# Patient Record
Sex: Male | Born: 1960 | Hispanic: Yes | Marital: Married | Smoking: Former smoker
Health system: Southern US, Community
[De-identification: ages and names within clinical notes are randomized; demographics above are authoritative.]

## PROBLEM LIST (undated history)

## (undated) DIAGNOSIS — R569 Unspecified convulsions: Secondary | ICD-10-CM

## (undated) NOTE — *Deleted (*Deleted)
  [facility] EMERGENCY DEPARTMENT Provider Note   CSN: [id number] Arrival date & time: 09/08/20  1703     History Chief Complaint  Patient presents with  . Seizures    Darrell Thompson is a 64 y.o. male.  HPI     Past Medical History:  Diagnosis Date  . Hypertension 2020    Patient Active Problem List   Diagnosis Date Noted  . Hypertension 2020    No past surgical history on file.     Family History  Problem Relation Age of Onset  . Heart disease Mother     Social History   Tobacco Use  . Smoking status: Former Smoker    Packs/day: 1.00    Types: Cigarettes    Quit date: 10/26/1983    Years since quitting: 36.8  . Smokeless tobacco: Never Used  Vaping Use  . Vaping Use: Never used  Substance Use Topics  . Alcohol use: Yes    Comment: Rare--maybe Christmas  . Drug use: Never    Home Medications Prior to Admission medications   Medication Sig Start Date End Date Taking? Authorizing Provider  lisinopril (ZESTRIL) 10 MG tablet Take 1 tablet (10 mg total) by mouth daily. 07/15/20   Julieanne Manson, MD  metFORMIN (GLUCOPHAGE-XR) 500 MG 24 hr tablet 1 tab by mouth twice daily with a meal 07/15/20   Julieanne Manson, MD  simvastatin (ZOCOR) 40 MG tablet Take 1 tablet (40 mg total) by mouth at bedtime. 07/15/20   Julieanne Manson, MD    Allergies    Patient has no known allergies.  Review of Systems   Review of Systems  Physical Exam Updated Vital Signs BP 133/74 (BP Location: Right Arm)   Pulse (!) 117   Temp 97.9 F (36.6 C) (Oral)   Resp 17   SpO2 93%   Physical Exam  ED Results / Procedures / Treatments   Labs (all labs ordered are listed, but only abnormal results are displayed) Labs Reviewed - No data to display  EKG None  Radiology No results found.  Procedures Procedures (including critical care time)  Medications Ordered in ED Medications - No data to display  ED Course  I have reviewed the triage  vital signs and the nursing notes.  Pertinent labs & imaging results that were available during my care of the patient were reviewed by me and considered in my medical decision making (see chart for details).    MDM Rules/Calculators/A&P                          *** Final Clinical Impression(s) / ED Diagnoses Final diagnoses:  None    Rx / DC Orders ED Discharge Orders    None

---

## 2018-10-25 DIAGNOSIS — I10 Essential (primary) hypertension: Secondary | ICD-10-CM

## 2018-10-25 HISTORY — DX: Essential (primary) hypertension: I10

## 2020-06-13 ENCOUNTER — Ambulatory Visit: Payer: Self-pay | Admitting: Internal Medicine

## 2020-06-13 ENCOUNTER — Encounter: Payer: Self-pay | Admitting: Internal Medicine

## 2020-06-13 ENCOUNTER — Other Ambulatory Visit: Payer: Self-pay

## 2020-06-13 VITALS — BP 122/84 | HR 72 | Resp 12 | Ht 61.5 in | Wt 152.0 lb

## 2020-06-13 DIAGNOSIS — H6122 Impacted cerumen, left ear: Secondary | ICD-10-CM

## 2020-06-13 DIAGNOSIS — M7581 Other shoulder lesions, right shoulder: Secondary | ICD-10-CM

## 2020-06-13 DIAGNOSIS — I10 Essential (primary) hypertension: Secondary | ICD-10-CM

## 2020-06-13 NOTE — Progress Notes (Signed)
Subjective:    Patient ID: Darrell Thompson, male   DOB: 05-09-1961, 59 y.o.   MRN: [id number]   HPI   Here to establish Violeta Gelinas Cruz's son in law.    1.  Ringing in ear:  Swimming in pool about 8 weeks ago.  Got water in ear and then ringing started.  Describes continuous high pitched noise.  No hearing loss or pain from ear.  No drainage from ear. Obtained some drops OTC from pharmacy, but no help with ringing.   2.  Left sided neck pain and upper right arm pain also starting about 8 weeks ago.  States he cannot abduct right shoulder above 90 degrees. States awakened with the pain. Does not remember an acute injury.  Cannot recall repetitive movement or work prior that may have set this off. Sleeps with his right hand on his chest and then on his abdomen. No numbness or tingling in his hand.    3.  Hypertension:  Diagnosed about 1 year ago.  Does well on Lisinopril.   Current Meds  Medication Sig  . lisinopril (ZESTRIL) 10 MG tablet Take 10 mg by mouth daily.   No Known Allergies   Past Medical History:  Diagnosis Date  . Hypertension 2020    History reviewed. No pertinent surgical history.  Family History  Problem Relation Age of Onset  . Heart disease Mother     Social History   Socioeconomic History  . Marital status: Married    Spouse name: [place]  . Number of children: 6  . Years of education: Not on file  . Highest education level: Some college, no degree  Occupational History  . Occupation: Airline pilot in [employer]  Tobacco Use  . Smoking status: Former Smoker    Packs/day: 1.00    Types: Cigarettes    Quit date: 10/26/1983    Years since quitting: 36.6  . Smokeless tobacco: Never Used  Vaping Use  . Vaping Use: Never used  Substance and Sexual Activity  . Alcohol use: Yes    Comment: Rare--maybe Christmas  . Drug use: Never  . Sexual activity: Not on file  Other Topics Concern  . Not on file  Social History Narrative   Lives at home with wife,  mother in law (also a patient here), son and daughter   Social Determinants of Health   Financial Resource Strain:   . Difficulty of Paying Living Expenses: Not on file  Food Insecurity: No Food Insecurity  . Worried About Programme researcher, broadcasting/film/video in the Last Year: Never true  . Ran Out of Food in the Last Year: Never true  Transportation Needs: No Transportation Needs  . Lack of Transportation (Medical): No  . Lack of Transportation (Non-Medical): No  Physical Activity:   . Days of Exercise per Week: Not on file  . Minutes of Exercise per Session: Not on file  Stress:   . Feeling of Stress : Not on file  Social Connections:   . Frequency of Communication with Friends and Family: Not on file  . Frequency of Social Gatherings with Friends and Family: Not on file  . Attends Religious Services: Not on file  . Active Member of Clubs or Organizations: Not on file  . Attends Banker Meetings: Not on file  . Marital Status: Not on file  Intimate Partner Violence: Not At Risk  . Fear of Current or Ex-Partner: No  . Emotionally Abused: No  . Physically Abused:  No  . Sexually Abused: No      Review of Systems    Objective:   BP 122/84 (BP Location: Left Arm, Patient Position: Sitting, Cuff Size: Normal)   Pulse 72   Resp 12   Ht 5' 1.5" (1.562 m)   Wt 152 lb (68.9 kg)   BMI 28.26 kg/m   Physical Exam  NAD HEENT:  PERRL, EOMI, TM on right pearly gray with normal canal.  Dark cerumen impacted in left canal deep and likely in contact with TM, which cannot be viewed. Neck:  Mild tenderness and tightness of left trap and cervical paraspinous musculature.   Chest:  CTA CV:  RRR with normal S1 and S2, No S3, S4 or murmur.  No carotid bruits.  Carotid, radial and DP pulses normal and equal Abd:  S, NT, No HSM or mass, + BS LE:  No edema MS, right UE:  Unable to abduct or flex right shoulder above 90 degrees and painful at full passive ROM with these movements.  Mild  tenderness over subacromial bursa.  NT over [facility] and CC joints as well as scapula and right trap.  Decreased resistance against downward force on forearm with shoulder abducted to 90 degrees and internally rotated.   Assessment & Plan   1.  Left sided cerumen impaction:  Debrox drops in ear twice daily for 2 days prior to lavage--return next week to clear out.  2.  Right rotator cuff tendinitis:  Went over gentle ROM exercises.  Not interested in PT at this time.  Ibuprofen 400-600 mg twice daily with meals for 14 days.  3.  Hypertension:  Refilled Lisinopril.  Fasting labs same day returns for ear lavage/curettage.  4.  HM:  Schedule for flu vaccine.  With me in 4 months for CPE

## 2020-06-13 NOTE — Patient Instructions (Signed)
Debrox or Visine ear drops for ear wax softening and then removal --3-4 drops twice daily in left ear for 2 days prior to appt  Ibuprofen 400-600 mg twice daily with meals for 2 weeks  Range of motion exercises for right shoulder twice daily.

## 2020-06-19 ENCOUNTER — Other Ambulatory Visit: Payer: Self-pay

## 2020-06-19 DIAGNOSIS — Z79899 Other long term (current) drug therapy: Secondary | ICD-10-CM

## 2020-06-19 DIAGNOSIS — Z13220 Encounter for screening for lipoid disorders: Secondary | ICD-10-CM

## 2020-06-19 DIAGNOSIS — Z125 Encounter for screening for malignant neoplasm of prostate: Secondary | ICD-10-CM

## 2020-06-20 LAB — COMPREHENSIVE METABOLIC PANEL
ALT: 32 IU/L (ref 0–44)
AST: 20 IU/L (ref 0–40)
Albumin/Globulin Ratio: 1.4 (ref 1.2–2.2)
Albumin: 4.4 g/dL (ref 3.8–4.9)
Alkaline Phosphatase: 122 IU/L — ABNORMAL HIGH (ref 48–121)
BUN/Creatinine Ratio: 15 (ref 10–24)
BUN: 16 mg/dL (ref 8–27)
Bilirubin Total: 0.6 mg/dL (ref 0.0–1.2)
CO2: 23 mmol/L (ref 20–29)
Calcium: 9.7 mg/dL (ref 8.6–10.2)
Chloride: 99 mmol/L (ref 96–106)
Creatinine, Ser: 1.05 mg/dL (ref 0.76–1.27)
GFR calc Af Amer: 89 mL/min/{1.73_m2} (ref >59)
GFR calc non Af Amer: 77 mL/min/{1.73_m2} (ref >59)
Globulin, Total: 3.2 g/dL (ref 1.5–4.5)
Glucose: 103 mg/dL — ABNORMAL HIGH (ref 65–99)
Potassium: 4.9 mmol/L (ref 3.5–5.2)
Sodium: 139 mmol/L (ref 134–144)
Total Protein: 7.6 g/dL (ref 6.0–8.5)

## 2020-06-20 LAB — CBC WITH DIFFERENTIAL/PLATELET
Basophils Absolute: 0.1 10*3/uL (ref 0.0–0.2)
Basos: 1 %
EOS (ABSOLUTE): 0.2 10*3/uL (ref 0.0–0.4)
Eos: 2 %
Hematocrit: 50.8 % (ref 37.5–51.0)
Hemoglobin: 16.8 g/dL (ref 13.0–17.7)
Immature Grans (Abs): 0 10*3/uL (ref 0.0–0.1)
Immature Granulocytes: 0 %
Lymphocytes Absolute: 3.6 10*3/uL — ABNORMAL HIGH (ref 0.7–3.1)
Lymphs: 44 %
MCH: 30 pg (ref 26.6–33.0)
MCHC: 33.1 g/dL (ref 31.5–35.7)
MCV: 91 fL (ref 79–97)
Monocytes Absolute: 0.7 10*3/uL (ref 0.1–0.9)
Monocytes: 8 %
Neutrophils Absolute: 3.6 10*3/uL (ref 1.4–7.0)
Neutrophils: 45 %
Platelets: 254 10*3/uL (ref 150–450)
RBC: 5.6 x10E6/uL (ref 4.14–5.80)
RDW: 13.6 % (ref 11.6–15.4)
WBC: 8 10*3/uL (ref 3.4–10.8)

## 2020-06-20 LAB — LIPID PANEL W/O CHOL/HDL RATIO
Cholesterol, Total: 262 mg/dL — ABNORMAL HIGH (ref 100–199)
HDL: 30 mg/dL — ABNORMAL LOW (ref >39)
LDL Chol Calc (NIH): 175 mg/dL — ABNORMAL HIGH (ref 0–99)
Triglycerides: 294 mg/dL — ABNORMAL HIGH (ref 0–149)
VLDL Cholesterol Cal: 57 mg/dL — ABNORMAL HIGH (ref 5–40)

## 2020-06-20 LAB — PSA: Prostate Specific Ag, Serum: 0.6 ng/mL (ref 0.0–4.0)

## 2020-06-24 ENCOUNTER — Ambulatory Visit: Payer: Self-pay | Admitting: Internal Medicine

## 2020-06-24 ENCOUNTER — Encounter: Payer: Self-pay | Admitting: Internal Medicine

## 2020-06-24 VITALS — BP 110/72 | HR 60 | Resp 12 | Ht 61.5 in | Wt 150.0 lb

## 2020-06-24 DIAGNOSIS — H6122 Impacted cerumen, left ear: Secondary | ICD-10-CM

## 2020-06-24 NOTE — Progress Notes (Signed)
    Subjective:    Patient ID: Darrell Thompson, male   DOB: [birth year], 60 y.o.   MRN: [id number]   HPI   Here for left ear lavage after using Debrox ear drops in left ear for past 2 days.  Still with tinnitus and decreased hearing on the left.  Current Meds  Medication Sig  . lisinopril (ZESTRIL) 10 MG tablet Take 10 mg by mouth daily.   No Known Allergies   Review of Systems    Objective:   BP 110/72 (BP Location: Left Arm, Patient Position: Sitting, Cuff Size: Normal)   Pulse 60   Resp 12   Ht 5' 1.5" (1.562 m)   Wt 150 lb (68 kg)   BMI 27.88 kg/m   Physical Exam   Soft dark cerumen impacted against left TM.  Warm water irrigation applied with quick resolution of cerumen impaction.  TM normal after clearance of wax.  Patient could hear well and tinnitus resolved.   Assessment & Plan   Left cerumen impaction:  Resolved with irrigation.  No more Q tips in ear.

## 2020-06-24 NOTE — Patient Instructions (Signed)
Mix 1 part rubbing alcohol to 1 part white vinegar and put 4 drops of mixture in your left ear when you get home and then drain out immediately.   No more q tips in ears please. Or anything else.

## 2020-06-25 LAB — HGB A1C W/O EAG: Hgb A1c MFr Bld: 7.9 % — ABNORMAL HIGH (ref 4.8–5.6)

## 2020-06-25 LAB — SPECIMEN STATUS REPORT

## 2020-07-15 MED ORDER — METFORMIN HCL ER 500 MG PO TB24: ORAL_TABLET | ORAL | 11 refills | Status: DC

## 2020-07-15 MED ORDER — SIMVASTATIN 40 MG PO TABS: 40.0000 mg | ORAL_TABLET | Freq: Every day | ORAL | 11 refills | Status: DC

## 2020-07-15 MED ORDER — LISINOPRIL 10 MG PO TABS
10.0000 mg | ORAL_TABLET | Freq: Every day | ORAL | 3 refills | Status: DC
Start: 2020-07-15 — End: 2021-07-31

## 2020-07-15 NOTE — Addendum Note (Signed)
Addended by: Marcene Duos on: 07/15/2020 09:56 PM   Modules accepted: Orders

## 2020-07-25 ENCOUNTER — Ambulatory Visit: Payer: Self-pay | Admitting: Internal Medicine

## 2020-09-01 ENCOUNTER — Other Ambulatory Visit: Payer: Self-pay

## 2020-09-02 ENCOUNTER — Other Ambulatory Visit: Payer: Self-pay

## 2020-09-04 ENCOUNTER — Ambulatory Visit: Payer: Self-pay | Admitting: Internal Medicine

## 2020-09-08 ENCOUNTER — Emergency Department (HOSPITAL_COMMUNITY): Payer: Self-pay

## 2020-09-08 ENCOUNTER — Emergency Department (HOSPITAL_COMMUNITY)
Admission: EM | Admit: 2020-09-08 | Discharge: 2020-09-09 | Disposition: A | Discharge status: Home / Self Care | Payer: Self-pay | Attending: Emergency Medicine | Admitting: Emergency Medicine

## 2020-09-08 DIAGNOSIS — Z87891 Personal history of nicotine dependence: Secondary | ICD-10-CM | POA: Insufficient documentation

## 2020-09-08 DIAGNOSIS — R000 Tachycardia, unspecified: Secondary | ICD-10-CM | POA: Insufficient documentation

## 2020-09-08 DIAGNOSIS — I10 Essential (primary) hypertension: Secondary | ICD-10-CM | POA: Insufficient documentation

## 2020-09-08 DIAGNOSIS — E119 Type 2 diabetes mellitus without complications: Secondary | ICD-10-CM | POA: Insufficient documentation

## 2020-09-08 DIAGNOSIS — Z7984 Long term (current) use of oral hypoglycemic drugs: Secondary | ICD-10-CM | POA: Insufficient documentation

## 2020-09-08 DIAGNOSIS — Z20822 Contact with and (suspected) exposure to covid-19: Secondary | ICD-10-CM | POA: Insufficient documentation

## 2020-09-08 DIAGNOSIS — R569 Unspecified convulsions: Secondary | ICD-10-CM | POA: Insufficient documentation

## 2020-09-08 DIAGNOSIS — Z79899 Other long term (current) drug therapy: Secondary | ICD-10-CM | POA: Insufficient documentation

## 2020-09-08 LAB — CBC WITH DIFFERENTIAL/PLATELET
Abs Immature Granulocytes: 0.16 10*3/uL — ABNORMAL HIGH (ref 0.00–0.07)
Basophils Absolute: 0.1 10*3/uL (ref 0.0–0.1)
Basophils Relative: 0 %
Eosinophils Absolute: 0.1 10*3/uL (ref 0.0–0.5)
Eosinophils Relative: 1 %
HCT: 49.7 % (ref 39.0–52.0)
Hemoglobin: 16.6 g/dL (ref 13.0–17.0)
Immature Granulocytes: 1 %
Lymphocytes Relative: 35 %
Lymphs Abs: 4.2 10*3/uL — ABNORMAL HIGH (ref 0.7–4.0)
MCH: 30 pg (ref 26.0–34.0)
MCHC: 33.4 g/dL (ref 30.0–36.0)
MCV: 89.9 fL (ref 80.0–100.0)
Monocytes Absolute: 0.7 10*3/uL (ref 0.1–1.0)
Monocytes Relative: 6 %
Neutro Abs: 6.8 10*3/uL (ref 1.7–7.7)
Neutrophils Relative %: 57 %
Platelets: 280 10*3/uL (ref 150–400)
RBC: 5.53 MIL/uL (ref 4.22–5.81)
RDW: 12.9 % (ref 11.5–15.5)
WBC: 12 10*3/uL — ABNORMAL HIGH (ref 4.0–10.5)
nRBC: 0 % (ref 0.0–0.2)

## 2020-09-08 LAB — BASIC METABOLIC PANEL
Anion gap: 16 — ABNORMAL HIGH (ref 5–15)
BUN: 13 mg/dL (ref 6–20)
CO2: 23 mmol/L (ref 22–32)
Calcium: 9.4 mg/dL (ref 8.9–10.3)
Chloride: 97 mmol/L — ABNORMAL LOW (ref 98–111)
Creatinine, Ser: 1.19 mg/dL (ref 0.61–1.24)
GFR, Estimated: >60 mL/min (ref >60)
Glucose, Bld: 177 mg/dL — ABNORMAL HIGH (ref 70–99)
Potassium: 4.1 mmol/L (ref 3.5–5.1)
Sodium: 136 mmol/L (ref 135–145)

## 2020-09-08 LAB — RESPIRATORY PANEL BY RT PCR (FLU A&B, COVID)
Influenza A by PCR: NEGATIVE
Influenza B by PCR: NEGATIVE
SARS Coronavirus 2 by RT PCR: NEGATIVE

## 2020-09-08 LAB — SALICYLATE LEVEL: Salicylate Lvl: <7 mg/dL — ABNORMAL LOW (ref 7.0–30.0)

## 2020-09-08 LAB — RAPID URINE DRUG SCREEN, HOSP PERFORMED
Amphetamines: NOT DETECTED
Barbiturates: NOT DETECTED
Benzodiazepines: NOT DETECTED
Cocaine: NOT DETECTED
Opiates: NOT DETECTED
Tetrahydrocannabinol: NOT DETECTED

## 2020-09-08 LAB — ETHANOL: Alcohol, Ethyl (B): <10 mg/dL (ref <10)

## 2020-09-08 LAB — CBG MONITORING, ED: Glucose-Capillary: 154 mg/dL — ABNORMAL HIGH (ref 70–99)

## 2020-09-08 LAB — MAGNESIUM: Magnesium: 2.3 mg/dL (ref 1.7–2.4)

## 2020-09-08 LAB — ACETAMINOPHEN LEVEL: Acetaminophen (Tylenol), Serum: <10 ug/mL — ABNORMAL LOW (ref 10–30)

## 2020-09-08 MED ORDER — LACTATED RINGERS IV BOLUS
1000.0000 mL | Freq: Once | INTRAVENOUS | Status: AC
  Administered 2020-09-08: 1000 mL via INTRAVENOUS

## 2020-09-08 MED ORDER — IOHEXOL 350 MG/ML SOLN
75.0000 mL | Freq: Once | INTRAVENOUS | Status: AC | PRN
  Administered 2020-09-08: 75 mL via INTRAVENOUS

## 2020-09-08 NOTE — ED Triage Notes (Signed)
Pt arrived via [facility] from home after pt was found down at home with minimal respirations and was put on non rebreather by fire dept. By time EMS got there pt was gcs 12. And on route to hospital pt had petite seizure per ems and was given 2mg  ativan. Pt GCS 14 on arrival to ED with only not oriented to time.

## 2020-09-08 NOTE — ED Provider Notes (Addendum)
[facility]   CSN: [id number] Arrival date & time: 09/08/20  1703     History Chief Complaint  Patient presents with  . Seizures    Darrell Thompson is a 59 y.o. male history of hypertension (per EMR) and diabetes (per patient) presents with EMS after being found down at home with minimal respirations.  Placed on nonrebreather by EMS and initial GCS of 12.  In route, per report patient had another seizure and was given 2 mg Ativan.  GCS 14 on arrival but GCS 15 on assessment, alert and oriented x3.  ABCs intact.  Patient states he does not know what happened and just remembers the last thing was being at home.  Thinks that he was sitting down but is not sure if he had a seizure or syncopized.  No previous similar symptoms.  No history of seizures.  Denies any pain at this time.   Seizures Seizure activity on arrival: no   Seizure type:  Unable to specify Preceding symptoms: no sensation of an aura present, no dizziness, no headache, no nausea, no numbness, no panic and no vision change   Initial focality:  Unable to specify Episode characteristics: unresponsiveness   Return to baseline: yes   Number of seizures this episode:  2 Progression:  Resolved Recent head injury:  No recent head injuries PTA treatment:  Lorazepam History of seizures: no        Past Medical History:  Diagnosis Date  . Hypertension 2020    Patient Active Problem List   Diagnosis Date Noted  . Hypertension 2020    No past surgical history on file.     Family History  Problem Relation Age of Onset  . Heart disease Mother     Social History   Tobacco Use  . Smoking status: Former Smoker    Packs/day: 1.00    Types: Cigarettes    Quit date: 10/26/1983    Years since quitting: 36.8  . Smokeless tobacco: Never Used  Vaping Use  . Vaping Use: Never used  Substance Use Topics  . Alcohol use: Yes    Comment: Rare--maybe Christmas  . Drug  use: Never    Home Medications Prior to Admission medications   Medication Sig Start Date End Date Taking? Authorizing Provider  dapagliflozin propanediol (FARXIGA) 10 MG TABS tablet Take 10 mg by mouth daily.   Yes [provider]  lisinopril (ZESTRIL) 10 MG tablet Take 1 tablet (10 mg total) by mouth daily. 07/15/20  Yes Julieanne MansonMulberry, Elizabeth, MD  Pseudoephedrine-APAP-DM (TYLENOL COLD/FLU DAY PO) Take 1 capsule by mouth once.   Yes [provider]  metFORMIN (GLUCOPHAGE-XR) 500 MG 24 hr tablet 1 tab by mouth twice daily with a meal Patient not taking: Reported on 09/08/2020 07/15/20   Julieanne MansonMulberry, Elizabeth, MD  simvastatin (ZOCOR) 40 MG tablet Take 1 tablet (40 mg total) by mouth at bedtime. Patient not taking: Reported on 09/08/2020 07/15/20   Julieanne MansonMulberry, Elizabeth, MD    Allergies    Patient has no known allergies.  Review of Systems   Review of Systems  Constitutional: Negative for chills, fatigue and fever.  HENT: Negative for ear pain and sore throat.   Eyes: Negative for pain and visual disturbance.  Respiratory: Negative for cough and shortness of breath.   Cardiovascular: Negative for chest pain and palpitations.  Gastrointestinal: Negative for abdominal pain, constipation, diarrhea, nausea and vomiting.  Genitourinary: Negative for dysuria and hematuria.  Musculoskeletal: Negative  for arthralgias, back pain, neck pain and neck stiffness.  Skin: Negative for color change and rash.  Neurological: Positive for seizures and syncope. Negative for dizziness, facial asymmetry, speech difficulty, weakness, light-headedness, numbness and headaches.  All other systems reviewed and are negative.   Physical Exam Updated Vital Signs BP (!) 144/83   Pulse 83   Temp 98.6 F (37 C)   Resp 16   SpO2 98%   Physical Exam Vitals and nursing note reviewed.  Constitutional:      General: He is not in acute distress.    Appearance: He is well-developed. He is not  ill-appearing or toxic-appearing.  HENT:     Head: Normocephalic and atraumatic.     Mouth/Throat:     Mouth: Mucous membranes are moist.     Comments: Bilateral, hemostatic superficial tongue lacerations Eyes:     Extraocular Movements: Extraocular movements intact.     Conjunctiva/sclera: Conjunctivae normal.     Pupils: Pupils are equal, round, and reactive to light.  Cardiovascular:     Rate and Rhythm: Regular rhythm. Tachycardia present.     Pulses: Normal pulses.     Heart sounds: No murmur heard.  No friction rub. No gallop.      Comments: HR 110bpm Pulmonary:     Effort: Pulmonary effort is normal. No respiratory distress.     Breath sounds: Normal breath sounds.     Comments: On 3 L nasal cannula, sats to 89% on RA during interview Abdominal:     General: Abdomen is flat. There is no distension.     Palpations: Abdomen is soft.     Tenderness: There is no abdominal tenderness.  Musculoskeletal:     Cervical back: Normal range of motion and neck supple. No rigidity or tenderness.  Skin:    General: Skin is warm and dry.  Neurological:     General: No focal deficit present.     Mental Status: He is alert and oriented to person, place, and time. Mental status is at baseline.     Cranial Nerves: No cranial nerve deficit.     Sensory: No sensory deficit.     Motor: No weakness.     Coordination: Coordination normal.     ED Results / Procedures / Treatments   Labs (all labs ordered are listed, but only abnormal results are displayed) Labs Reviewed  BASIC METABOLIC PANEL - Abnormal; Notable for the following components:      Result Value   Chloride 97 (*)    Glucose, Bld 177 (*)    Anion gap 16 (*)    All other components within normal limits  CBC WITH DIFFERENTIAL/PLATELET - Abnormal; Notable for the following components:   WBC 12.0 (*)    Lymphs Abs 4.2 (*)    Abs Immature Granulocytes 0.16 (*)    All other components within normal limits  ACETAMINOPHEN LEVEL  - Abnormal; Notable for the following components:   Acetaminophen (Tylenol), Serum <10 (*)    All other components within normal limits  SALICYLATE LEVEL - Abnormal; Notable for the following components:   Salicylate Lvl <7.0 (*)    All other components within normal limits  CBG MONITORING, ED - Abnormal; Notable for the following components:   Glucose-Capillary 154 (*)    All other components within normal limits  RESPIRATORY PANEL BY RT PCR (FLU A&B, COVID)  MAGNESIUM  ETHANOL  RAPID URINE DRUG SCREEN, HOSP PERFORMED  LACTIC ACID, PLASMA  LACTIC ACID, PLASMA  CBG  MONITORING, ED    EKG EKG Interpretation  Date/Time:  Monday September 08 2020 18:37:50 EST Ventricular Rate:  106 PR Interval:    QRS Duration: 70 QT Interval:  311 QTC Calculation: 413 R Axis:   49 Text Interpretation: Sinus tachycardia Borderline T abnormalities, inferior leads No old tracing to compare Confirmed by Dione Booze ([id number]) on 09/09/2020 12:02:09 AM   Radiology DG Chest 1 View  Result Date: 09/08/2020 CLINICAL DATA:  59 year old male with hypoxia. EXAM: CHEST  1 VIEW COMPARISON:  None. FINDINGS: There is mild diffuse chronic interstitial coarsening and bronchitic changes. No focal consolidation, pleural effusion, pneumothorax. The cardiac silhouette is limits. Acute osseous pathology. IMPRESSION: No active disease. Electronically Signed   By: Elgie Collard M.D.   On: 09/08/2020 18:42   CT Head Wo Contrast  Result Date: 09/08/2020 CLINICAL DATA:  Seizure.  Nontraumatic. EXAM: CT HEAD WITHOUT CONTRAST TECHNIQUE: Contiguous axial images were obtained from the base of the skull through the vertex without intravenous contrast. COMPARISON:  12/15/7. FINDINGS: Brain: No evidence of acute infarction, hemorrhage, hydrocephalus, extra-axial collection or mass lesion/mass effect. Vascular: No hyperdense vessel or unexpected calcification. Skull: Normal. Negative for fracture or focal lesion. Sinuses/Orbits:  Partial opacification of the right side of frontal sinus and right ethmoid air cells noted. Mastoid air cells are clear. Other: None. IMPRESSION: 1. No acute intracranial abnormalities. 2. Right frontal sinus and ethmoid air cell opacification. Electronically Signed   By: Signa Kell M.D.   On: 09/08/2020 19:41   CT Angio Chest PE W and/or Wo Contrast  Result Date: 09/08/2020 CLINICAL DATA:  PE suspected, high probability, shortness of breath with chest pain. EXAM: CT ANGIOGRAPHY CHEST WITH CONTRAST TECHNIQUE: Multidetector CT imaging of the chest was performed using the standard protocol during bolus administration of intravenous contrast. Multiplanar CT image reconstructions and MIPs were obtained to evaluate the vascular anatomy. CONTRAST:  75mL OMNIPAQUE IOHEXOL 350 MG/ML SOLN COMPARISON:  Radiograph 09/08/2020 FINDINGS: Cardiovascular: Satisfactory opacification of pulmonary arteries. No large central, lobar or proximal segmental filling defects are identified. Hypoattenuation involving a smaller subsegmental branch in the right lower lobe (7/179) is likely artifactual with an adjacent unopacified vessel. Central pulmonary arteries are normal caliber. Normal heart size. No pericardial effusion. Atherosclerotic plaque within the normal caliber aorta. No acute luminal abnormality of the imaged aorta. No periaortic stranding or hemorrhage. And Normal 3 vessel branching of the aortic arch. Proximal great vessels are unremarkable. Mediastinum/Nodes: No mediastinal fluid or gas. Normal thyroid gland and thoracic inlet. No acute abnormality of the trachea or esophagus. No worrisome mediastinal, hilar or axillary adenopathy. Lungs/Pleura: Low lung volumes with areas of atelectasis including more bandlike subsegmental atelectasis or scarring towards the lung bases. Findings are likely accentuated by imaging during exhalation for the angiographic technique. No consolidation, features of edema, pneumothorax, or  effusion. No suspicious pulmonary nodules or masses. Upper Abdomen: Few layering calcified gallstones within the gallbladder lumen. No pericholecystic inflammation or visible biliary ductal dilatation. Diffuse hepatic hypoattenuation compatible with hepatic steatosis. Sparing is present along the gallbladder fossa. Musculoskeletal: Multilevel degenerative changes are present in the imaged portions of the spine. Deformities of the T3, T5 and T12 endplates are favored to reflect some remote Schmorl's node formation. No acute osseous abnormality or suspicious osseous lesion. No worrisome chest wall lesions are identified. Review of the MIP images confirms the above findings. IMPRESSION: 1. No evidence of large central, lobar or proximal segmental pulmonary embolism. Hypoattenuation involving a smaller subsegmental branch in the right  lower lobe is likely artifactual with an adjacent unopacified vessel. 2. Low lung volumes and atelectatic changes including more bandlike subsegmental atelectasis or scarring towards the lung bases. 3. No other acute intrathoracic process. 4. Hepatic steatosis. 5. Cholelithiasis without evidence of acute cholecystitis. 6. Aortic Atherosclerosis (ICD10-I70.0). Electronically Signed   By: Kreg Shropshire M.D.   On: 09/08/2020 21:29    Procedures Procedures (including critical care time)  Medications Ordered in ED Medications  lactated ringers bolus 1,000 mL (0 mLs Intravenous Stopped 09/08/20 2037)  iohexol (OMNIPAQUE) 350 MG/ML injection 75 mL (75 mLs Intravenous Contrast Given 09/08/20 2114)    ED Course  I have reviewed the triage vital signs and the nursing notes.  Pertinent labs & imaging results that were available during my care of the patient were reviewed by me and considered in my medical decision making (see chart for details).    MDM Rules/Calculators/A&P                          MDM Darrell Thompson is a 59 y.o. male who presents with seizures as per above. I  have reviewed the nursing documentation for past medical history, family history, and social history. Pertinent previous records reviewed. He is awake, alert. Hypoxic on RA, improved on 3L [place] on arrival. Tachycardic but normotensive. Afebrile. Physical exam is most notable for nonfocal neuro exam, tongue lacerations. Able to ambulate without assistance.  ER provider interpretation of Imaging / Radiology: No acute intracranial normalities on CT head.  CTA without PE or other causes of hypoxia. ER provider interpretation of EKG: Sinus tachycardia with HR 106, no acute signs of infarct or ischemia. QRS 70, QTc 413. No High degree heart block or significant arrhythmia. ER provider interpretation of Labs: AG 16 likely lactic acidosis after seizure (lab difficulties with lactic so not resulted).  No significant electrolyte abnormalities.  Differentials considered: Stroke or other intracranial abnormality, metabolic derangement, alcohol withdrawal, drug intoxication, PE, ACS  MDM: Stable 59 year old male with no history of seizures presents concern for seizure today.  Per wife who arrived at bedside later, states that the daughter saw him stare at her and then fell onto the ground and shake.  Unclear etiology of patient's seizures and seems to have had two today, given 2 mg Ativan with EMS.  AG 16 likely due to elevated lactic from seizure. Given 1L LR for acidosis. Do not feel repeat labwork is warranted given exam and reassessments.  Patient initially hypoxic but this resolved throughout his care in the ED.  Just prior to discharge, patient was SPO2 monitor and did not desat.  Unclear etiology of his hypoxemia but has resolved and do not feel that further work-up is indicated at this time.  Patient was initially tachycardic and hypoxic in the setting of possible syncope versus seizure so obtained CTA which is negative for PE or other findings to explain his symptoms.  Patient overall well-appearing, weaned to  room air, wife at bedside. Do not feel that neuro consultation or further emergent work-up is indicated at this time.  However, feel the patient would benefit from outpatient neurology work-up given new onset seizures at the age of 37. Patient voiced understanding and agreed.  Key discharge instructions: Encouraged close outpatient neurology follow-up.  Also give him seizure precautions; patient voiced understanding.  Strict return precautions provided. Encouraged him to follow-up with his PCP on an outpatient basis. Questions were answered.  Patient discharged in stable condition.  The plan for this patient was discussed with Dr. Jodi Mourning, who voiced agreement and who oversaw evaluation and treatment of this patient.   Final Clinical Impression(s) / ED Diagnoses Final diagnoses:  Seizure [facility])    Rx / DC Orders ED Discharge Orders    None          Gershon Mussel, MD 09/09/20 [id number]    Gershon Mussel, MD 09/09/20 Marcie Bal    Blane Ohara, MD 09/09/20 [id number]

## 2020-09-08 NOTE — ED Notes (Signed)
Pt ambulated with no trouble. Pt O2 sats remained 98% during duration of ambulation.

## 2020-09-08 NOTE — Discharge Instructions (Signed)
Thank you for allowing [place] to take care of you.  Follow-up with outpatient neurology.  Continue follow-up with neurology, avoid operating heavy machinery, driving, or swimming. Return to the ER if you have more seizures, chest pain, severe headache, vision changes, numbness, tingling, or weakness.

## 2020-09-09 ENCOUNTER — Encounter: Payer: Self-pay | Admitting: Neurology

## 2020-11-06 ENCOUNTER — Encounter: Payer: Self-pay | Admitting: Internal Medicine

## 2020-12-09 ENCOUNTER — Other Ambulatory Visit: Payer: Self-pay

## 2020-12-09 ENCOUNTER — Encounter: Payer: Self-pay | Admitting: Neurology

## 2020-12-09 ENCOUNTER — Ambulatory Visit (INDEPENDENT_AMBULATORY_CARE_PROVIDER_SITE_OTHER): Payer: Self-pay | Admitting: Neurology

## 2020-12-09 VITALS — BP 158/95 | HR 83 | Ht 62.0 in | Wt 148.2 lb

## 2020-12-09 DIAGNOSIS — R569 Unspecified convulsions: Secondary | ICD-10-CM

## 2020-12-09 NOTE — Progress Notes (Signed)
NEUROLOGY CONSULTATION NOTE  Darrell Thompson MRN: [id number] DOB: Nov 14, 1960  Referring provider: Dr. Julieanne Manson Primary care provider: Dr. Julieanne Manson  Reason for consult:  seizure  Dear Dr Delrae Alfred:  Thank you for your kind referral of Darrell Thompson for consultation of the above symptoms. Although his history is well known to you, please allow me to reiterate it for the purpose of our medical record. The patient was accompanied to the clinic by his wife who also provides collateral information. Records and images were personally reviewed where available.   HISTORY OF PRESENT ILLNESS: This is a 60 year old right-handed man with a history of hypertension, diabetes, presenting for evaluation of new onset seizures on 09/08/2020. He was feeling tired and sleepy that day, he did not sleep much the night prior (less than 2 hours) and had not eaten all morning. He took an over the counter multi-symptom medication, then 1.5 hours later as he was eating he felt dizzy, then his daughter alerted his wife that he was looking at her weird. He then vocalized and started having a convulsion lasting a few minutes. He bit his tongue. He was unresponsive with sonorous respirations after. When EMS arrived, he was unresponsive with minimal respirations, GCS 12. En route, patient had another seizure and was given 2mg  Ativan. He reports waking up in the ambulance feeling fine. He was initially hypoxic in the ER, which resolved. CTA chest negative for PE. Head CT no acute changes. Bloodwork showed a CBG of 154, WBC of 12, EtOH and UDS negative. He denies any further seizures since 08/2020. He and his wife deny any staring/unresponsive episodes, gaps in time, olfactory/gustatory hallucinations, deja vu, rising epigastric sensation, focal numbness/tingling/weakness, myoclonic jerks. He denies any headaches, dizziness, diplopia, dysarthria/dysphagia, neck/back pain, bowel/bladder dysfunction. He has some  right shoulder pain. He has been sleeping better since then. He does not drink alcohol. He has not been working recently, he works in 09/2020. He had a normal birth and early development.  There is no history of febrile convulsions, CNS infections such as meningitis/encephalitis, significant traumatic brain injury, neurosurgical procedures, or family history of seizures.   PAST MEDICAL HISTORY: Past Medical History:  Diagnosis Date  . Hypertension 2020    PAST SURGICAL HISTORY: History reviewed. No pertinent surgical history.  MEDICATIONS: Current Outpatient Medications on File Prior to Visit  Medication Sig Dispense Refill  . dapagliflozin propanediol (FARXIGA) 10 MG TABS tablet Take 10 mg by mouth daily.    Airline pilot lisinopril (ZESTRIL) 10 MG tablet Take 1 tablet (10 mg total) by mouth daily. 90 tablet 3  . meloxicam (MOBIC) 15 MG tablet Take 15 mg by mouth daily.     No current facility-administered medications on file prior to visit.    ALLERGIES: No Known Allergies  FAMILY HISTORY: Family History  Problem Relation Age of Onset  . Heart disease Mother     SOCIAL HISTORY: Social History   Socioeconomic History  . Marital status: Married    Spouse name: [place]  . Number of children: 6  . Years of education: Not on file  . Highest education level: Some college, no degree  Occupational History  . 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 Smoking status: Former Smoker    Packs/day: 1.00    Types: Cigarettes    Quit date: 10/26/1983    Years since quitting: 37.1  . Smokeless tobacco: Never Used  Vaping Use  . Vaping Use: Never used  Substance and Sexual  Activity  . Alcohol use: Not Currently    Comment: Rare--maybe Christmas  . Drug use: Never  . Sexual activity: Not on file  Other Topics Concern  . Not on file  Social History Narrative   Lives at home with wife, mother in law (also a patient here), son and daughter   Right handed    Social Determinants of Health    Financial Resource Strain: Not on file  Food Insecurity: No Food Insecurity  . Worried About Programme researcher, broadcasting/film/video in the Last Year: Never true  . Ran Out of Food in the Last Year: Never true  Transportation Needs: No Transportation Needs  . Lack of Transportation (Medical): No  . Lack of Transportation (Non-Medical): No  Physical Activity: Not on file  Stress: Not on file  Social Connections: Not on file  Intimate Partner Violence: Not At Risk  . Fear of Current or Ex-Partner: No  . Emotionally Abused: No  . Physically Abused: No  . Sexually Abused: No     PHYSICAL EXAM: Vitals:   12/09/20 1028  BP: (!) 158/95  Pulse: 83  SpO2: 97%   General: No acute distress Head:  Normocephalic/atraumatic Skin/Extremities: No rash, no edema Neurological Exam: Mental status: alert and awake, no dysarthria or aphasia, Fund of knowledge is appropriate.  Recent and remote memory are intact.  Attention and concentration are normal.    Cranial nerves: CN I: not tested CN II: pupils equal, round and reactive to light, visual fields intact CN III, IV, Thompson:  full range of motion, no nystagmus, no ptosis CN V: facial sensation intact CN VII: upper and lower face symmetric CN VIII: hearing intact to conversation Bulk & Tone: normal, no fasciculations. Motor: 5/5 throughout with no pronator drift. Sensation: intact to light touch, cold, pin, vibration sense.  No extinction to double simultaneous stimulation.  Romberg test negative Deep Tendon Reflexes: +2 throughout Cerebellar: no incoordination on finger to nose testing Gait: narrow-based and steady Tremor: none   IMPRESSION: This is a 60 year old right-handed man with a history of hypertension, diabetes, presenting for evaluation of new onset seizures on 09/08/2020. Seizure concerning for focal seizure that secondarily generalized, he has no clear epilepsy risk factors and normal neurological exam. We discussed that after an initial seizure,  unless there are significant risk factors, an abnormal neurological exam, an EEG showing epileptiform abnormalities, and/or abnormal neuroimaging, treatment with an antiepileptic drug is not indicated. We discussed 10% of the population may have a single seizure. Patients with a single unprovoked seizure have a recurrence rate of 33% after a single seizure and 73% after a second seizure. He will apply for [facility] Financial Aid and proceed with EEG and MRI when able. We discussed [place] driving restrictions which indicate a patient needs to free of seizures or events of altered awareness for 6 months prior to resuming driving. Avoidance of seizure triggers discussed. Follow-up in 6 months or earlier if needed, they know to call for any changes.   Thank you for allowing me to participate in the care of this patient. Please do not hesitate to call for any questions or concerns.   Patrcia Dolly, M.D.  CC: Dr. Delrae Alfred

## 2020-12-09 NOTE — Patient Instructions (Signed)
1. Apply for [facility] Financial Assistance  2. Schedule 1-hour EEG   3. Schedule MRI brain with and without contrast  4. Follow-up in 6 months, call for any changes   Seizure Precautions: 1. If medication has been prescribed for you to prevent seizures, take it exactly as directed.  Do not stop taking the medicine without talking to your doctor first, even if you have not had a seizure in a long time.   2. Avoid activities in which a seizure would cause danger to yourself or to others.  Don't operate dangerous machinery, swim alone, or climb in high or dangerous places, such as on ladders, roofs, or girders.  Do not drive unless your doctor says you may.  3. If you have any warning that you may have a seizure, lay down in a safe place where you can't hurt yourself.    4.  No driving for 6 months from last seizure, as per [facility].   Please refer to the following link on the Epilepsy Foundation of America's website for more information: http://www.epilepsyfoundation.org/answerplace/Social/driving/drivingu.cfm   5.  Maintain good sleep hygiene. Avoid alcohol.  6.  Contact your doctor if you have any problems that may be related to the medicine you are taking.  7.  Call 911 and bring the patient back to the ED if:        A.  The seizure lasts longer than 5 minutes.       B.  The patient doesn't awaken shortly after the seizure  C.  The patient has new problems such as difficulty seeing, speaking or moving  D.  The patient was injured during the seizure  E.  The patient has a temperature over 102 F (39C)  F.  The patient vomited and now is having trouble breathing

## 2021-04-01 ENCOUNTER — Encounter: Payer: Self-pay | Admitting: Internal Medicine

## 2021-05-26 ENCOUNTER — Ambulatory Visit: Payer: Self-pay | Admitting: Neurology

## 2021-07-31 ENCOUNTER — Ambulatory Visit: Payer: Self-pay | Admitting: Internal Medicine

## 2021-07-31 ENCOUNTER — Encounter: Payer: Self-pay | Admitting: Internal Medicine

## 2021-07-31 ENCOUNTER — Other Ambulatory Visit: Payer: Self-pay

## 2021-07-31 VITALS — BP 112/74 | HR 104 | Resp 12 | Ht 61.5 in | Wt 147.5 lb

## 2021-07-31 DIAGNOSIS — E119 Type 2 diabetes mellitus without complications: Secondary | ICD-10-CM

## 2021-07-31 DIAGNOSIS — Z79899 Other long term (current) drug therapy: Secondary | ICD-10-CM

## 2021-07-31 DIAGNOSIS — I10 Essential (primary) hypertension: Secondary | ICD-10-CM

## 2021-07-31 DIAGNOSIS — E782 Mixed hyperlipidemia: Secondary | ICD-10-CM

## 2021-07-31 DIAGNOSIS — R569 Unspecified convulsions: Secondary | ICD-10-CM

## 2021-07-31 MED ORDER — LOSARTAN POTASSIUM 100 MG PO TABS: ORAL_TABLET | ORAL | 11 refills | Status: DC

## 2021-07-31 NOTE — Progress Notes (Signed)
Subjective:    Patient ID: Darrell Thompson, male   DOB: 09/03/1961, 60 y.o.   MRN: [id number]   HPI  Intermittent interpretation by wife, also a patient, Darrell Thompson.  Planned for CPE, but had seizure like activity this morning and am finding out new history today.   He has missed previous appointments in follow up with what was thought to be new onset DM and hyperlipidemia, but shares today he already knew he had DM when seen for first time in August of 2021 for first time.  Has been following with a physician in [place].   New Onset Seizures:  Occurred previously just once before 09/08/20.  Wife observed.  Had not had much sleep and had not eaten until at the time of the seizure--he was starting to eat at that time.  At noon that day, he felt he was developing a cold and fever and took Nyquil capsules.  At 3 p.m. he was eating soup and acted funny--looking at people at table odd and then vocalized loudly in chair as he fell backwards with limbs shaking, lasting for 1 minute.  Was out with sonorous breathing thereafter for about 15 minutes.  He did bite his tongue Underwent tox screen, which was negative, as well as CT of head, which showed opacification of right sinuses,  CT angio of chest did not show PE.   Blood glucose at home reportedly okay and in ED 177.  ASA and tylenol levels negative.  WBC slightly high at 12.0 with slightly high lymphocytes.   Was seen by Dr. Karel Jarvis at [facility] on 12/09/20.  No medications prescribed.  Was recommended once finances available to perform MR of brain and EEG.  They have not completed application for financial aid with [facility] following ED visit.  He does not have a [facility] orange card.    This morning at 2:30 a.m., sleeping prone, lifted his head and then cried out again followed by shaking of body similar to before for 30 secs, then the sonorous breathing again for only 8 minutes.  EMS was called and arrived around 7 minutes later.  Was confused once  awakened.  About 30 minutes later, he was thinking clearly and aware of his surroundings.  Also bit his tongue with this episode.  Neither episode resulted in loss of bladder or bowel control.   Rhythm strip  showed sinus tach with HR at 130 BP 176/88, SAO2 at 94% and CBG of 158 with EMS.  He felt fine at that point and did not feel necessary to go to ED.   He ate around 5-5:30 p.m last night --meat and rice dish.     2.  DM:  Diagnosed with DM likely in February of 2021.  Sounds like he was initiated on Glipizide or sulfonylurea once daily, but felt tired after 1 week, so was switched to [place] 10 mg daily and Bydureon 2 mg SQ once weekly sometime later, the latter of which he has been using maybe twice monthly.   He established with me in August of 2021, though did not share his diagnosis of DM nor medications then.  Noted ultimately to have A1C of 7.9% and elevated cholesterol, so was instructed to start Metformin or Simvastatin.  States did start the Simvastatin, but did not continue and did not follow up.    3.  Daily dry cough per wife.  Patient denies it to be much of a problem.  Wife states it is.  Current Meds  Medication Sig   dapagliflozin propanediol (FARXIGA) 10 MG TABS tablet Take 10 mg by mouth daily.   lisinopril (ZESTRIL) 10 MG tablet Take 1 tablet (10 mg total) by mouth daily.  Bydureon 2 mg SQ once weekly (not clear when last used in past month)  No Known Allergies   Review of Systems    Objective:   BP 112/74 (BP Location: Right Arm, Patient Position: Sitting, Cuff Size: Normal)   Pulse (!) 104   Resp 12   Ht 5' 1.5" (1.562 m)   Wt 147 lb 8 oz (66.9 kg)   BMI 27.42 kg/m   Physical Exam NAD HEENT:  PERRL, EOMI, TMs pearly gray, throat without injection Neck:  Supple, No adenopathy, no thyromegaly Chest:  CTA CV:  RRR with normal S1 and S2, No S3, S4 or murmur.  No carotid bruit.  Carotid, radial and DP pulses normal and equal. Abd:  S, NT, No HSM or mass,  + BS LE:  no edema Neuro:  A & O x 3, CN II-XII grossly intact.  DTRs 2+/4 and Motor 5/5 throughout.  Gait normal.   Assessment & Plan   Seizure:  second episode today.  Would like to go ahead with MR brain/EEG Needs to finish up on financial assistance with [facility] (wife, Darrell Thompson, states she has to get another document to [facility])  to see if meets criteria so can order MR and EEG through [facility].   Otherwise, to sign up for orange card and can obtain MR of brain through orange card, though will not get EEG covered that way. Will call into Dr. Rosalyn Gess office to ask about MR/EEG/medication at this point.  2.  DM:  would like to stop using Bydureon, states too complicated to use and not using as recommended.  Does not want to make a change until have lab results back.  A1C, CMP  3.  Hyperlipidemia: lipid profile  4.  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  Will need to return in 1 month to recheck BP  5.  Hypertension:  controlled, but changing medication due to likely ACE I cough--as in #4  08/04/2021:  called into Dr. Rosalyn Gess office regarding patient and left message on how to contact me to discuss concerns with another seizure.

## 2021-08-01 LAB — COMPREHENSIVE METABOLIC PANEL
ALT: 47 IU/L — ABNORMAL HIGH (ref 0–44)
AST: 34 IU/L (ref 0–40)
Albumin/Globulin Ratio: 1.5 (ref 1.2–2.2)
Albumin: 4.9 g/dL — ABNORMAL HIGH (ref 3.8–4.8)
Alkaline Phosphatase: 127 IU/L — ABNORMAL HIGH (ref 44–121)
BUN/Creatinine Ratio: 13 (ref 10–24)
BUN: 15 mg/dL (ref 8–27)
Bilirubin Total: 0.7 mg/dL (ref 0.0–1.2)
CO2: 24 mmol/L (ref 20–29)
Calcium: 10.3 mg/dL — ABNORMAL HIGH (ref 8.6–10.2)
Chloride: 98 mmol/L (ref 96–106)
Creatinine, Ser: 1.12 mg/dL (ref 0.76–1.27)
Globulin, Total: 3.3 g/dL (ref 1.5–4.5)
Glucose: 142 mg/dL — ABNORMAL HIGH (ref 70–99)
Potassium: 4.5 mmol/L (ref 3.5–5.2)
Sodium: 139 mmol/L (ref 134–144)
Total Protein: 8.2 g/dL (ref 6.0–8.5)
eGFR: 75 mL/min/{1.73_m2} (ref >59)

## 2021-08-01 LAB — CBC WITH DIFFERENTIAL/PLATELET
Basophils Absolute: 0 10*3/uL (ref 0.0–0.2)
Basos: 0 %
EOS (ABSOLUTE): 0 10*3/uL (ref 0.0–0.4)
Eos: 0 %
Hematocrit: 49.7 % (ref 37.5–51.0)
Hemoglobin: 16.7 g/dL (ref 13.0–17.7)
Immature Grans (Abs): 0 10*3/uL (ref 0.0–0.1)
Immature Granulocytes: 0 %
Lymphocytes Absolute: 3.4 10*3/uL — ABNORMAL HIGH (ref 0.7–3.1)
Lymphs: 25 %
MCH: 29.7 pg (ref 26.6–33.0)
MCHC: 33.6 g/dL (ref 31.5–35.7)
MCV: 88 fL (ref 79–97)
Monocytes Absolute: 1 10*3/uL — ABNORMAL HIGH (ref 0.1–0.9)
Monocytes: 7 %
Neutrophils Absolute: 9 10*3/uL — ABNORMAL HIGH (ref 1.4–7.0)
Neutrophils: 68 %
Platelets: 314 10*3/uL (ref 150–450)
RBC: 5.62 x10E6/uL (ref 4.14–5.80)
RDW: 13.8 % (ref 11.6–15.4)
WBC: 13.6 10*3/uL — ABNORMAL HIGH (ref 3.4–10.8)

## 2021-08-01 LAB — LIPID PANEL W/O CHOL/HDL RATIO
Cholesterol, Total: 278 mg/dL — ABNORMAL HIGH (ref 100–199)
HDL: 34 mg/dL — ABNORMAL LOW (ref >39)
LDL Chol Calc (NIH): 199 mg/dL — ABNORMAL HIGH (ref 0–99)
Triglycerides: 230 mg/dL — ABNORMAL HIGH (ref 0–149)
VLDL Cholesterol Cal: 45 mg/dL — ABNORMAL HIGH (ref 5–40)

## 2021-08-01 LAB — HGB A1C W/O EAG: Hgb A1c MFr Bld: 8 % — ABNORMAL HIGH (ref 4.8–5.6)

## 2021-08-06 DIAGNOSIS — E119 Type 2 diabetes mellitus without complications: Secondary | ICD-10-CM | POA: Insufficient documentation

## 2021-08-06 DIAGNOSIS — R569 Unspecified convulsions: Secondary | ICD-10-CM | POA: Insufficient documentation

## 2021-08-06 DIAGNOSIS — E782 Mixed hyperlipidemia: Secondary | ICD-10-CM | POA: Insufficient documentation

## 2021-08-14 ENCOUNTER — Telehealth: Payer: Self-pay

## 2021-08-14 ENCOUNTER — Telehealth: Payer: Self-pay | Admitting: Internal Medicine

## 2021-08-14 MED ORDER — LEVETIRACETAM 500 MG PO TABS: 500.0000 mg | ORAL_TABLET | Freq: Two times a day (BID) | ORAL | 11 refills | Status: DC

## 2021-08-14 NOTE — Telephone Encounter (Signed)
Will also need to discuss DM/hyperlipidemia treatment

## 2021-08-14 NOTE — Telephone Encounter (Signed)
Spoke with Dr. Karel Jarvis, Neurology, today Recommended starting Keppra 500 mge twice daily Remind him he cannot drive. Will set him back up with Dr. Karel Jarvis as well. Need to speak with [facility] about where they are with applying for financial assistance with [facility].

## 2021-08-14 NOTE — Telephone Encounter (Signed)
Spoke to Dr. Delrae Alfred, recommended starting Keppra 500mg  BID. No driving, he is aware. She will start Keppra and have him f/u in our office so we can proceed with previously discussed tests.  Front staff, when he calls, ok to put on work in slot (waitlist if needed). thanks

## 2021-08-14 NOTE — Telephone Encounter (Signed)
Dr Alden Benjamin called in stated pt had a 2nd seizure wanted to talk to Dr Karel Jarvis about getting to set up with MRI of the Brain EEG and started on medication. Pt has no insurance she wants to help pt with an orange card or financial assistance, Dr Alden Benjamin cell number is [phone] office number is [phone]

## 2021-08-21 ENCOUNTER — Ambulatory Visit: Payer: Self-pay | Admitting: Neurology

## 2021-09-14 ENCOUNTER — Emergency Department (HOSPITAL_COMMUNITY)
Admission: EM | Admit: 2021-09-14 | Discharge: 2021-09-14 | Disposition: A | Discharge status: Home / Self Care | Payer: Self-pay | Attending: Emergency Medicine | Admitting: Emergency Medicine

## 2021-09-14 ENCOUNTER — Telehealth: Payer: Self-pay | Admitting: Neurology

## 2021-09-14 ENCOUNTER — Other Ambulatory Visit (HOSPITAL_COMMUNITY): Payer: Self-pay

## 2021-09-14 ENCOUNTER — Encounter (HOSPITAL_COMMUNITY): Payer: Self-pay

## 2021-09-14 ENCOUNTER — Other Ambulatory Visit: Payer: Self-pay

## 2021-09-14 DIAGNOSIS — E119 Type 2 diabetes mellitus without complications: Secondary | ICD-10-CM | POA: Insufficient documentation

## 2021-09-14 DIAGNOSIS — Z7984 Long term (current) use of oral hypoglycemic drugs: Secondary | ICD-10-CM | POA: Insufficient documentation

## 2021-09-14 DIAGNOSIS — Z87891 Personal history of nicotine dependence: Secondary | ICD-10-CM | POA: Insufficient documentation

## 2021-09-14 DIAGNOSIS — Z79899 Other long term (current) drug therapy: Secondary | ICD-10-CM | POA: Insufficient documentation

## 2021-09-14 DIAGNOSIS — I10 Essential (primary) hypertension: Secondary | ICD-10-CM | POA: Insufficient documentation

## 2021-09-14 DIAGNOSIS — R569 Unspecified convulsions: Secondary | ICD-10-CM | POA: Insufficient documentation

## 2021-09-14 LAB — CBC WITH DIFFERENTIAL/PLATELET
Abs Immature Granulocytes: 0.03 10*3/uL (ref 0.00–0.07)
Basophils Absolute: 0 10*3/uL (ref 0.0–0.1)
Basophils Relative: 0 %
Eosinophils Absolute: 0 10*3/uL (ref 0.0–0.5)
Eosinophils Relative: 0 %
HCT: 48.8 % (ref 39.0–52.0)
Hemoglobin: 16.1 g/dL (ref 13.0–17.0)
Immature Granulocytes: 0 %
Lymphocytes Relative: 15 %
Lymphs Abs: 1.8 10*3/uL (ref 0.7–4.0)
MCH: 29.5 pg (ref 26.0–34.0)
MCHC: 33 g/dL (ref 30.0–36.0)
MCV: 89.5 fL (ref 80.0–100.0)
Monocytes Absolute: 0.8 10*3/uL (ref 0.1–1.0)
Monocytes Relative: 7 %
Neutro Abs: 9.2 10*3/uL — ABNORMAL HIGH (ref 1.7–7.7)
Neutrophils Relative %: 78 %
Platelets: 295 10*3/uL (ref 150–400)
RBC: 5.45 MIL/uL (ref 4.22–5.81)
RDW: 13.7 % (ref 11.5–15.5)
WBC: 11.9 10*3/uL — ABNORMAL HIGH (ref 4.0–10.5)
nRBC: 0 % (ref 0.0–0.2)

## 2021-09-14 LAB — BASIC METABOLIC PANEL
Anion gap: 11 (ref 5–15)
BUN: 15 mg/dL (ref 8–23)
CO2: 24 mmol/L (ref 22–32)
Calcium: 9.1 mg/dL (ref 8.9–10.3)
Chloride: 103 mmol/L (ref 98–111)
Creatinine, Ser: 1.07 mg/dL (ref 0.61–1.24)
GFR, Estimated: >60 mL/min (ref >60)
Glucose, Bld: 180 mg/dL — ABNORMAL HIGH (ref 70–99)
Potassium: 4 mmol/L (ref 3.5–5.1)
Sodium: 138 mmol/L (ref 135–145)

## 2021-09-14 LAB — CBG MONITORING, ED: Glucose-Capillary: 165 mg/dL — ABNORMAL HIGH (ref 70–99)

## 2021-09-14 MED ORDER — LEVETIRACETAM 500 MG PO TABS
500.0000 mg | ORAL_TABLET | Freq: Two times a day (BID) | ORAL | 3 refills | Status: DC
  Filled 2021-09-14: qty 60, 30d supply, fill #0

## 2021-09-14 MED ORDER — LEVETIRACETAM IN NACL 1000 MG/100ML IV SOLN
1000.0000 mg | Freq: Once | INTRAVENOUS | Status: AC
  Administered 2021-09-14: 1000 mg via INTRAVENOUS
  Filled 2021-09-14: qty 100

## 2021-09-14 NOTE — Discharge Instructions (Signed)
You were seen in the emergency department after a probable seizure at home.  You had blood work done and were given an IV dose of seizure medication.  We are prescribing you seizure medication to take twice a day.  We will be important for you to contact Dr. Karel Jarvis neurology for follow-up.  You should not drive until you are cleared by neurology.

## 2021-09-14 NOTE — Telephone Encounter (Signed)
Pls confirm if he was taking the Levetiracetam after Dr. Delrae Alfred prescribed it last month. If not, take Levetiracetam 500mg  twice a day as prescribed by ER. Looks like [facility] has it for $9, can check and we'll send Rx there for him. He needs to start taking medication now that he has had several more seizures since the last time I saw him. No driving until 6 months seizure-free. Thanks

## 2021-09-14 NOTE — Telephone Encounter (Signed)
Pt wife stated ER told them he needed to take the levetracetam and follow up with Dr Karel Jarvis, she was transferred to the front to schedule an appointment she stated that they have not heard anything from [facility] financial assistance

## 2021-09-14 NOTE — Discharge Planning (Signed)
RNCM consulted regarding medication assistance of pt unable to afford medications.  RNCM utilized [facility] cash to cover $14 co-pay and filled through [facility].  Rx e-scribed to [facility] and delivered to pt prior to discharge home.  No further RNCM needs identified at this time.

## 2021-09-14 NOTE — Telephone Encounter (Signed)
Patient had a seizure this morning and went to [facility] his wife Clotilde Dieter wants to speak with Someone about this  please call.

## 2021-09-14 NOTE — ED Triage Notes (Signed)
Patient brought in via ems from home. Had witnessed tonic clonic seizure at 0559 lasting about 1 minute. Patient presents A&Ox4, VSS

## 2021-09-14 NOTE — ED Provider Notes (Signed)
[facility] EMERGENCY DEPARTMENT Provider Note   CSN: [id number] Arrival date & time: 09/14/21  0741     History No chief complaint on file.   Darrell Thompson is a 60 y.o. male.  He has a history of diabetes hypertension and has had 2 seizures in his past.  He was at home in bed when he experienced another possible seizure lasting about 90 seconds.  It sounds like he was having tonic-clonic movement and was unresponsive.  He was with his wife at the time who activated EMS.  His mental status was confused postictal initially and now about 30 minutes later he is awake and alert.  He denies any medical complaints.  He has not been taking any type of seizure medication.  He denies any recent illness he has no medical complaints currently.  He works for [employer].  Denies any alcohol or drugs.  He does primarily speak Spanish although he was comfortable speaking in [place].  I offered an interpreter and he declined.  The history is provided by the patient and the EMS personnel.  Seizures Seizure activity on arrival: no   Seizure type:  Grand mal Initial focality:  Unable to specify Episode characteristics: generalized shaking and unresponsiveness   Postictal symptoms: confusion and somnolence   Return to baseline: yes   Severity:  Unable to specify Duration:  90 seconds Timing:  Once Number of seizures this episode:  1 Progression:  Resolved Context: medical non-compliance   Context: not alcohol withdrawal   Recent head injury:  No recent head injuries PTA treatment:  None History of seizures: yes       Past Medical History:  Diagnosis Date   Hypertension 2020    Patient Active Problem List   Diagnosis Date Noted   Mixed hyperlipidemia 08/06/2021   Type 2 diabetes mellitus without complication, without long-term current use of insulin (HCC) 08/06/2021   Seizure (HCC) 08/06/2021   Hypertension 2020    No past surgical history on file.     Family History   Problem Relation Age of Onset   Heart disease Mother     Social History   Tobacco Use   Smoking status: Former    Packs/day: 1.00    Types: Cigarettes    Quit date: 10/26/1983    Years since quitting: 37.9   Smokeless tobacco: Never  Vaping Use   Vaping Use: Never used  Substance Use Topics   Alcohol use: Not Currently    Comment: Rare--maybe Christmas   Drug use: Never    Home Medications Prior to Admission medications   Medication Sig Start Date End Date Taking? Authorizing Provider  dapagliflozin propanediol (FARXIGA) 10 MG TABS tablet Take 10 mg by mouth daily.    [provider]  Exenatide ER (BYDUREON) 2 MG PEN Inject 2 mg into the skin once a week.    [provider]  levETIRAcetam (KEPPRA) 500 MG tablet Take 1 tablet (500 mg total) by mouth 2 (two) times daily. 08/14/21   Julieanne Manson, MD  losartan (COZAAR) 100 MG tablet 1/2 tab by mouth daily 07/31/21   Julieanne Manson, MD    Allergies    Patient has no known allergies.  Review of Systems   Review of Systems  Constitutional:  Negative for fever.  HENT:  Negative for sore throat.   Eyes:  Negative for visual disturbance.  Respiratory:  Negative for shortness of breath.   Cardiovascular:  Negative for chest pain.  Gastrointestinal:  Negative  for abdominal pain.  Genitourinary:  Negative for dysuria.  Musculoskeletal:  Negative for neck pain.  Skin:  Negative for rash.  Neurological:  Positive for seizures.   Physical Exam Updated Vital Signs BP 102/86   Pulse 95   Temp 98.6 F (37 C) (Oral)   Resp 13   Ht 5\' 2"  (1.575 m)   Wt 68 kg   SpO2 97%   BMI 27.44 kg/m   Physical Exam Vitals and nursing note reviewed.  Constitutional:      General: He is not in acute distress.    Appearance: Normal appearance. He is well-developed.  HENT:     Head: Normocephalic and atraumatic.     Mouth/Throat:     Mouth: Mucous membranes are moist.     Pharynx: Oropharynx is clear.  Eyes:      Conjunctiva/sclera: Conjunctivae normal.  Cardiovascular:     Rate and Rhythm: Normal rate and regular rhythm.     Heart sounds: No murmur heard. Pulmonary:     Effort: Pulmonary effort is normal. No respiratory distress.     Breath sounds: Normal breath sounds.  Abdominal:     Palpations: Abdomen is soft.     Tenderness: There is no abdominal tenderness.  Musculoskeletal:        General: No swelling, deformity or signs of injury. Normal range of motion.     Cervical back: Neck supple.  Skin:    General: Skin is warm and dry.     Capillary Refill: Capillary refill takes less than 2 seconds.  Neurological:     General: No focal deficit present.     Mental Status: He is alert and oriented to person, place, and time.     Cranial Nerves: No cranial nerve deficit.     Sensory: No sensory deficit.     Motor: No weakness.  Psychiatric:        Mood and Affect: Mood normal.    ED Results / Procedures / Treatments   Labs (all labs ordered are listed, but only abnormal results are displayed) Labs Reviewed  BASIC METABOLIC PANEL - Abnormal; Notable for the following components:      Result Value   Glucose, Bld 180 (*)    All other components within normal limits  CBC WITH DIFFERENTIAL/PLATELET - Abnormal; Notable for the following components:   WBC 11.9 (*)    Neutro Abs 9.2 (*)    All other components within normal limits  CBG MONITORING, ED - Abnormal; Notable for the following components:   Glucose-Capillary 165 (*)    All other components within normal limits    EKG None  Radiology No results found.  Procedures Procedures   Medications Ordered in ED Medications  levETIRAcetam (KEPPRA) IVPB 1000 mg/100 mL premix (has no administration in time range)    ED Course  I have reviewed the triage vital signs and the nursing notes.  Pertinent labs & imaging results that were available during my care of the patient were reviewed by me and considered in my medical  decision making (see chart for details).  Clinical Course as of 09/14/21 1722  Mon Sep 14, 2021  0927 [facility] was consulted and was able to get him a prescription for Keppra.  Will discharge, recommend outpatient neurology follow-up recommend no driving until cleared by neurology. [MB]    Clinical Course User Index [MB] Hayden Rasmussen, MD   MDM Rules/Calculators/A&P  This patient complains of seizure; this involves an extensive number of treatment Options and is a complaint that carries with it a high risk of complications and Morbidity. The differential includes seizure, noncompliance, metabolic derangement, infection  I ordered, reviewed and interpreted labs, which included CBC with mildly elevated white count normal hemoglobin, chemistries normal other than elevated glucose in the setting of diabetes I ordered medication IV Keppra load  Additional history obtained from EMS Previous records obtained and reviewed in epic, prior visits for seizures.  At that time was put on Keppra although patient not taking.  He was also to follow-up with outpatient neurology and did not do this I consulted transitions of care and discussed lab and imaging findings, asked them to help financially with getting patient's seizure medications  Critical Interventions: None  After the interventions stated above, I reevaluated the patient and found patient awake alert stable on his feet.  He has received prescription for Keppra and instructions for outpatient follow-up with neurology.  Counseled not to drive until cleared by neurology.  Return instructions discussed   Final Clinical Impression(s) / ED Diagnoses Final diagnoses:  Seizure [facility])    Rx / DC Orders ED Discharge Orders          Ordered    levETIRAcetam (KEPPRA) 500 MG tablet  2 times daily        09/14/21 0851             Hayden Rasmussen, MD 09/14/21 1724

## 2021-09-14 NOTE — Telephone Encounter (Signed)
Spoke with pt wife he has not been taken the medication they had not picked it up from the pharmacy. He wanted to wait until he had an MRI to see if he needed the medication . I told them that with him having the seizures he needed the medication that she needed to drive and go get it and have him start it per Dr Karel Jarvis. Levetiracetam 500mg  twice a day. They have also been advised NO driving for MR [place] until 6 months seizure free,

## 2021-09-22 ENCOUNTER — Other Ambulatory Visit: Payer: Self-pay | Admitting: Internal Medicine

## 2021-10-05 ENCOUNTER — Other Ambulatory Visit: Payer: Self-pay | Admitting: Internal Medicine

## 2021-10-05 MED ORDER — METFORMIN HCL ER 500 MG PO TB24: ORAL_TABLET | ORAL | 11 refills | Status: DC

## 2021-10-05 MED ORDER — LOSARTAN POTASSIUM 100 MG PO TABS
ORAL_TABLET | ORAL | 11 refills | Status: DC
Start: 2021-10-05 — End: 2022-12-20

## 2021-10-05 MED ORDER — ATORVASTATIN CALCIUM 40 MG PO TABS: ORAL_TABLET | ORAL | 11 refills | Status: DC

## 2021-10-13 ENCOUNTER — Ambulatory Visit: Payer: Self-pay | Admitting: Neurology

## 2021-12-02 ENCOUNTER — Telehealth: Payer: Self-pay | Admitting: Neurology

## 2021-12-02 DIAGNOSIS — R569 Unspecified convulsions: Secondary | ICD-10-CM

## 2021-12-02 NOTE — Telephone Encounter (Signed)
Patients wife called and stated that she needs her husband to have an MRI.

## 2021-12-03 NOTE — Telephone Encounter (Signed)
Spoke with pt wife she stated that they will be self pay pt needs his MRI to be done,

## 2021-12-03 NOTE — Addendum Note (Signed)
Addended by: Dimas Chyle on: 12/03/2021 09:28 AM   Modules accepted: Orders

## 2021-12-03 NOTE — Telephone Encounter (Signed)
We had discussed applying for [facility] [employer] to proceed to MRI brain, if she would like to proceed, pls order MRI brain with and without contrast, thanks

## 2021-12-21 ENCOUNTER — Other Ambulatory Visit: Payer: Self-pay

## 2021-12-21 ENCOUNTER — Other Ambulatory Visit: Payer: Self-pay | Admitting: Internal Medicine

## 2021-12-21 ENCOUNTER — Ambulatory Visit (HOSPITAL_COMMUNITY)
Admission: RE | Admit: 2021-12-21 | Discharge: 2021-12-21 | Disposition: A | Discharge status: Home / Self Care | Payer: Self-pay | Source: Ambulatory Visit | Attending: Neurology | Admitting: Neurology

## 2021-12-21 DIAGNOSIS — R569 Unspecified convulsions: Secondary | ICD-10-CM | POA: Insufficient documentation

## 2021-12-21 MED ORDER — GADOBUTROL 1 MMOL/ML IV SOLN
6.0000 mL | Freq: Once | INTRAVENOUS | Status: AC | PRN
  Administered 2021-12-21: 6 mL via INTRAVENOUS

## 2021-12-22 ENCOUNTER — Telehealth: Payer: Self-pay

## 2021-12-22 NOTE — Telephone Encounter (Signed)
Pt called no answer per DPR left a voice mail brain MRI is normal, no tumor, stroke, or bleed seen. There were some changes in his sinuses, if he is congested or has sinus problems, pls talk to his PCP and questions call the office back at [phone]

## 2021-12-22 NOTE — Telephone Encounter (Signed)
-----   Message from Cameron Sprang, MD sent at 12/22/2021  8:31 AM EST ----- Pls let patient/wife know the brain MRI is normal, no tumor, stroke, or bleed seen. There were some changes in his sinuses, if he is congested or has sinus problems, pls talk to his PCP. Thanks

## 2022-01-22 ENCOUNTER — Ambulatory Visit: Payer: Self-pay | Admitting: Neurology

## 2022-11-28 IMAGING — MR MR HEAD WO/W CM
17 of 19 series · 42 of 48 positions shown · IV contrast (gadavist)
Comparison: Head CT 09/08/2020.

CLINICAL DATA: Provided history: New onset seizure.

EXAM:
MRI HEAD WITHOUT AND WITH CONTRAST
TECHNIQUE: Multiplanar, multiecho pulse sequences of the brain and surrounding
structures were obtained without and with intravenous contrast.
CONTRAST:  6mL GADAVIST GADOBUTROL 1 MMOL/ML IV SOLN

[Series 5: DWI · axial · 3.0mm · 0.88mm/px · z∈[-101,+52]mm · 5 of 104 slices shown (1 of 4)]
[im 1/104]
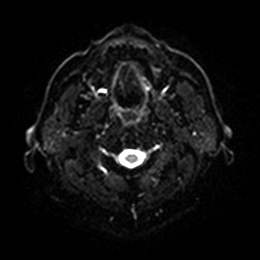
[im 26/104]
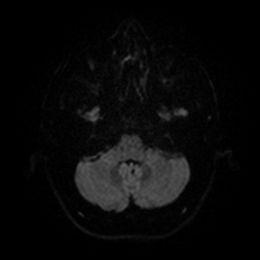
[im 52/104]
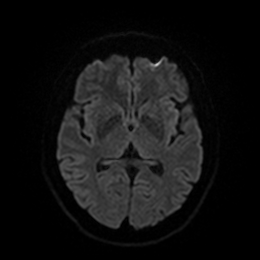
[im 78/104]
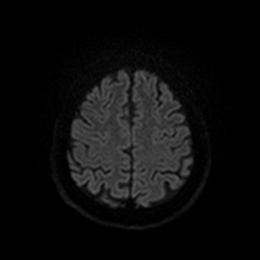
[im 104/104]
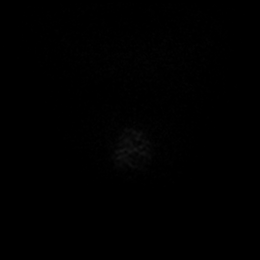

[Series 6: DWI · axial · 3.0mm · 0.88mm/px · z∈[-101,+52]mm · 3 of 52 slices shown (2 of 4)]
[im 1/52]
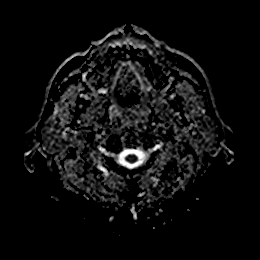
[im 26/52]
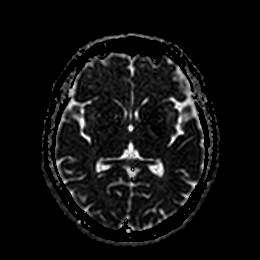
[im 52/52]
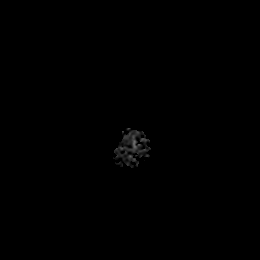

[Series 7: DWI · coronal · 4.0mm · 0.88mm/px · 3 of 72 slices shown (3 of 4)]
[im 1/72]
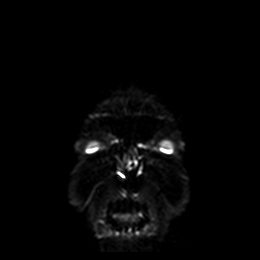
[im 36/72]
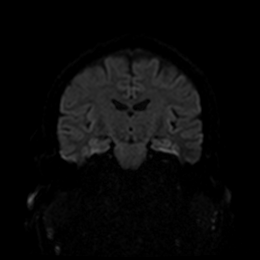
[im 72/72]
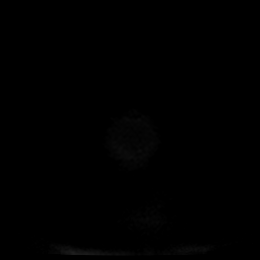

[Series 8: DWI · coronal · 4.0mm · 0.88mm/px · 2 of 36 slices shown (4 of 4)]
[im 1/36]
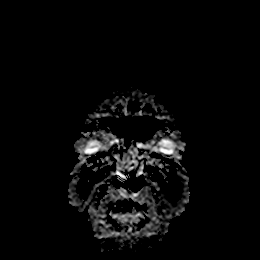
[im 36/36]
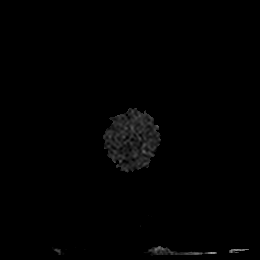

[Series 9: T1 · sagittal · 5.0mm · 0.75mm/px · 1 of 23 slices shown]
[im 1/23]
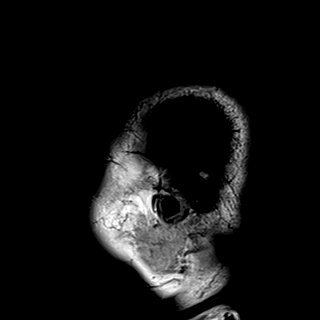

[Series 10: T2 · axial · 5.0mm · 0.72mm/px · 1 of 25 slices shown (1 of 2)]
[im 1/25]
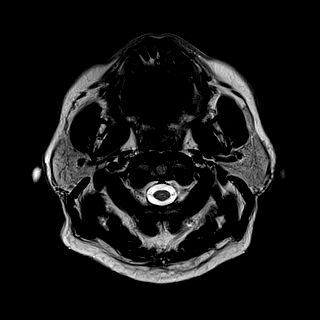

[Series 11: FLAIR · axial · 5.0mm · 0.45mm/px · 1 of 25 slices shown (1 of 2)]
[im 1/25]
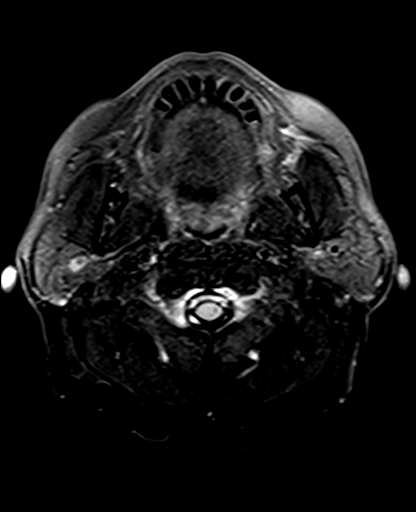

[Series 12: mag_images · axial · 3.0mm · 0.90mm/px · z∈[-108,+69]mm · 3 of 60 slices shown]
[im 1/60]
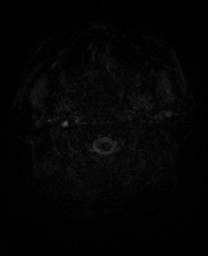
[im 30/60]
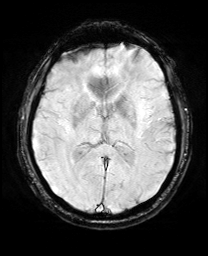
[im 60/60]
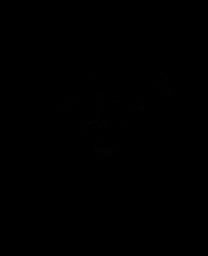

[Series 13: pha_images · axial · 3.0mm · 0.90mm/px · z∈[-108,+69]mm · 2 of 56 slices shown]
[im 1/56]
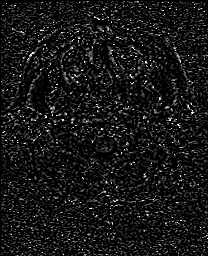
[im 56/56]
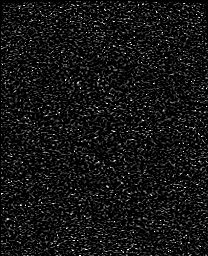

[Series 14: swi_images · axial · 3.0mm · 0.90mm/px · z∈[-108,+69]mm · 3 of 60 slices shown]
[im 1/60]
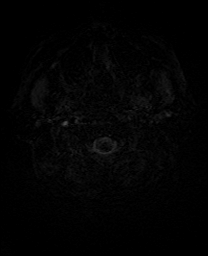
[im 30/60]
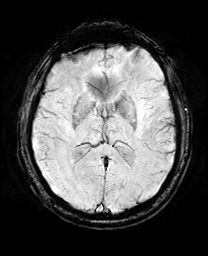
[im 60/60]
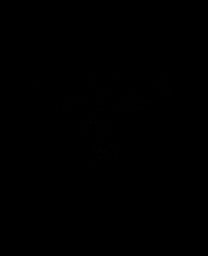

[Series 15: mip_images(sw) · axial · 24.0mm · 0.90mm/px · z∈[-97,+58]mm · 2 of 53 slices shown]
[im 1/53]
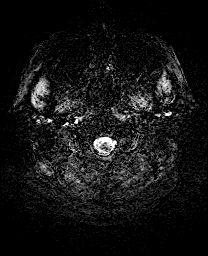
[im 53/53]
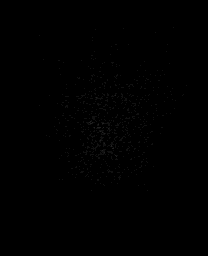

[Series 17: t1_mprage_tra_p2_iso · axial · 1.0mm · 0.98mm/px · z∈[-108,+67]mm · 7 of 176 slices shown]
[im 1/176]
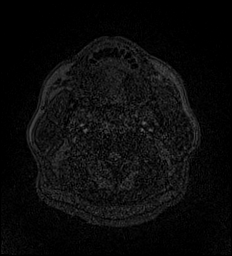
[im 30/176]
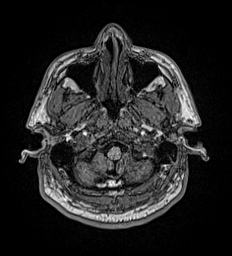
[im 59/176]
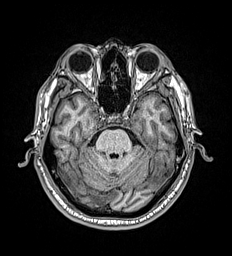
[im 88/176]
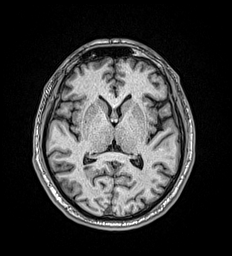
[im 117/176]
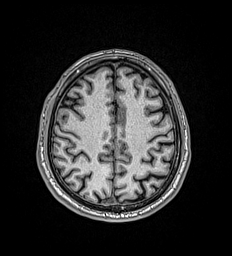
[im 146/176]
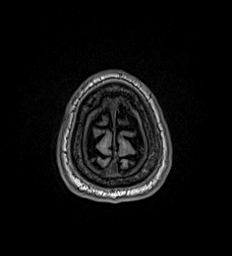
[im 176/176]
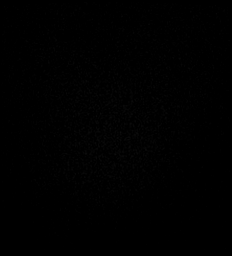

[Series 18: t1_mprage_tra_p2_iso_mpr_coronal · coronal · 1.0mm · 0.45mm/px · 5 of 150 slices shown]
[im 1/150]
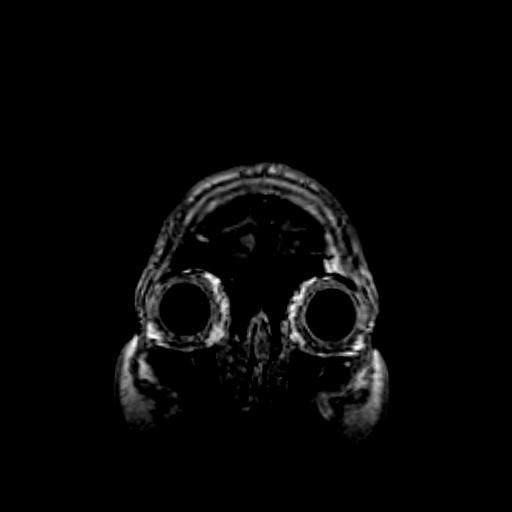
[im 30/150]
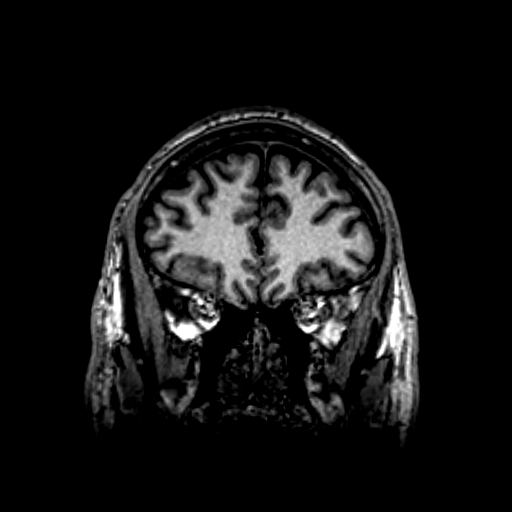
[im 60/150]
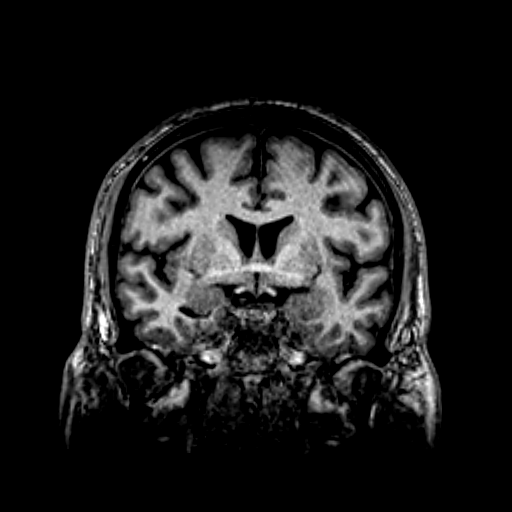
[im 90/150]
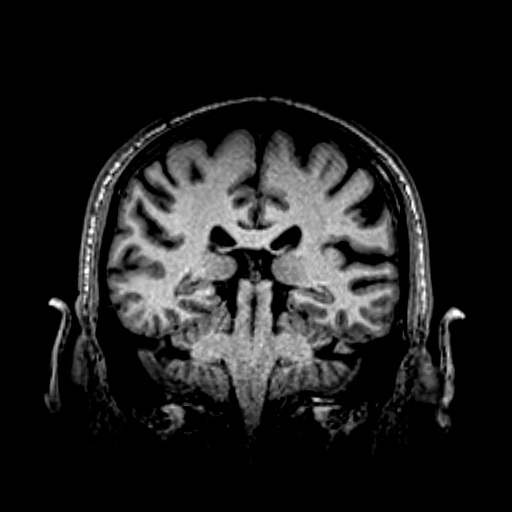
[im 120/150]
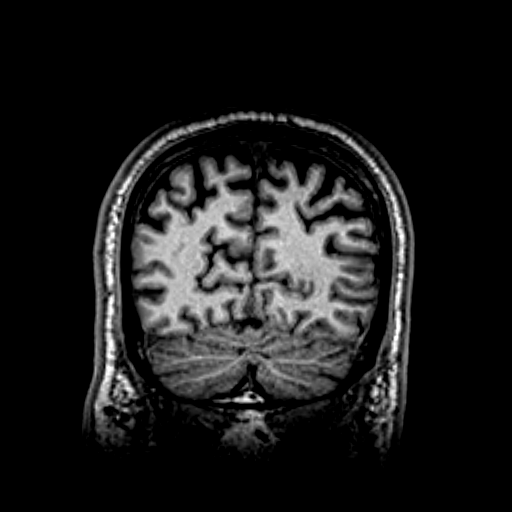

[Series 19: T2 · coronal · 3.0mm · 0.30mm/px · 1 of 34 slices shown (2 of 2)]
[im 1/34]
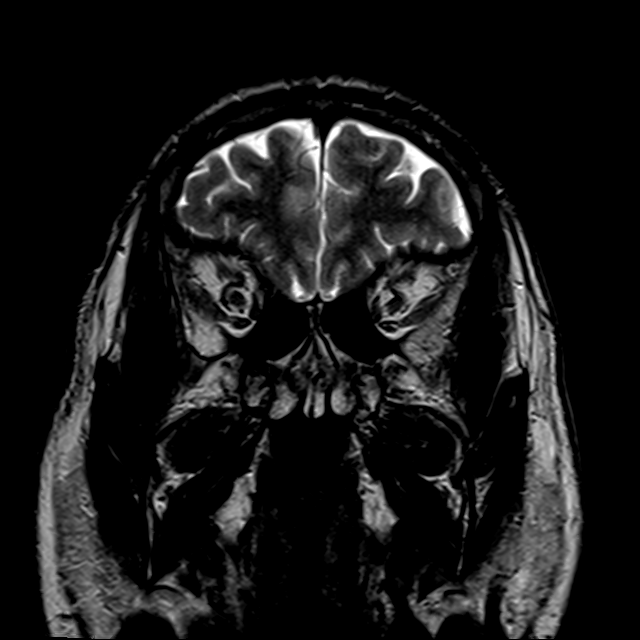

[Series 20: FLAIR · coronal · 3.0mm · 0.59mm/px · 1 of 34 slices shown (2 of 2)]
[im 1/34]
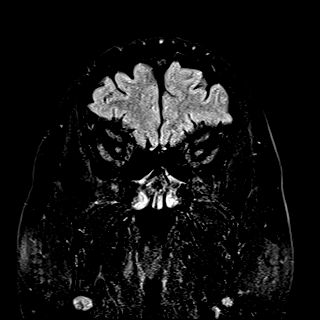

[Series 21: T2 post-contrast · coronal · 5.0mm · 0.72mm/px · 1 of 28 slices shown]
[im 1/28]
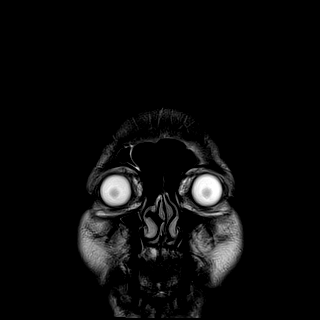

[Series 23: T1 post-contrast · coronal · 5.0mm · 0.34mm/px · 1 of 28 slices shown]
[im 1/28]
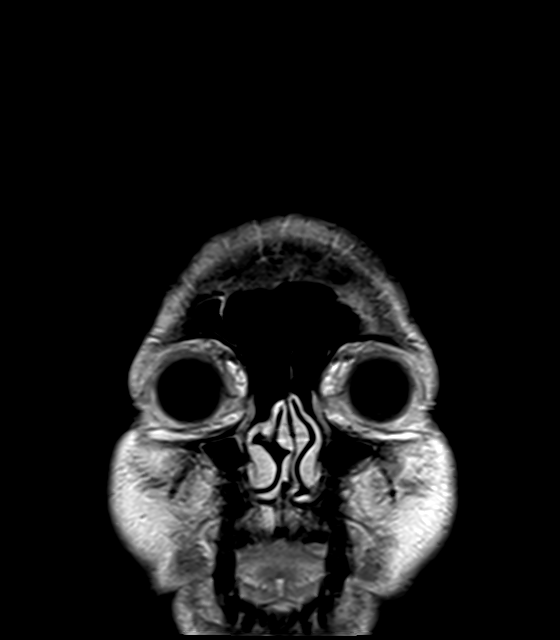

[42 of 48 positions shown; findings below may reference images not displayed]

FINDINGS: Mild intermittent motion degradation.

Brain:

Cerebral volume appears within normal limits.

No cortical encephalomalacia is identified. No significant cerebral
white matter disease for age.

The hippocampi are symmetric in size and signal.

There is no acute infarct.

No evidence of an intracranial mass.

No chronic intracranial blood products.

No extra-axial fluid collection.

No midline shift.

No pathologic intracranial enhancement identified.

Vascular: Maintained flow voids within the proximal large arterial
vessels.

Skull and upper cervical spine: No focal suspicious marrow lesion.

Sinuses/Orbits: Visualized orbits show no acute finding. Mucosal
thickening within the right frontoethmoidal recess and anterior
right ethmoid air cells.
IMPRESSION: Unremarkable MRI appearance of the brain. No evidence of acute
intracranial abnormality.

No specific seizure focus is identified.

Right frontoethmoidal sinus disease, as described.

## 2022-12-18 ENCOUNTER — Other Ambulatory Visit: Payer: Self-pay | Admitting: Internal Medicine

## 2022-12-19 ENCOUNTER — Encounter (HOSPITAL_COMMUNITY): Payer: Self-pay | Admitting: Emergency Medicine

## 2022-12-19 ENCOUNTER — Other Ambulatory Visit: Payer: Self-pay

## 2022-12-19 ENCOUNTER — Emergency Department (HOSPITAL_COMMUNITY)
Admission: EM | Admit: 2022-12-19 | Discharge: 2022-12-19 | Disposition: A | Discharge status: Home / Self Care | Payer: BLUE CROSS/BLUE SHIELD | Attending: Emergency Medicine | Admitting: Emergency Medicine

## 2022-12-19 DIAGNOSIS — I10 Essential (primary) hypertension: Secondary | ICD-10-CM | POA: Diagnosis not present

## 2022-12-19 DIAGNOSIS — Z7984 Long term (current) use of oral hypoglycemic drugs: Secondary | ICD-10-CM | POA: Insufficient documentation

## 2022-12-19 DIAGNOSIS — R000 Tachycardia, unspecified: Secondary | ICD-10-CM | POA: Diagnosis not present

## 2022-12-19 DIAGNOSIS — G40909 Epilepsy, unspecified, not intractable, without status epilepticus: Secondary | ICD-10-CM | POA: Diagnosis present

## 2022-12-19 DIAGNOSIS — R569 Unspecified convulsions: Secondary | ICD-10-CM

## 2022-12-19 DIAGNOSIS — Z79899 Other long term (current) drug therapy: Secondary | ICD-10-CM | POA: Insufficient documentation

## 2022-12-19 DIAGNOSIS — D72829 Elevated white blood cell count, unspecified: Secondary | ICD-10-CM | POA: Diagnosis not present

## 2022-12-19 DIAGNOSIS — R739 Hyperglycemia, unspecified: Secondary | ICD-10-CM

## 2022-12-19 DIAGNOSIS — E1165 Type 2 diabetes mellitus with hyperglycemia: Secondary | ICD-10-CM | POA: Diagnosis not present

## 2022-12-19 HISTORY — DX: Unspecified convulsions: R56.9

## 2022-12-19 LAB — CBC WITH DIFFERENTIAL/PLATELET
Abs Immature Granulocytes: 0.05 10*3/uL (ref 0.00–0.07)
Basophils Absolute: 0 10*3/uL (ref 0.0–0.1)
Basophils Relative: 0 %
Eosinophils Absolute: 0 10*3/uL (ref 0.0–0.5)
Eosinophils Relative: 0 %
HCT: 46.8 % (ref 39.0–52.0)
Hemoglobin: 15.8 g/dL (ref 13.0–17.0)
Immature Granulocytes: 0 %
Lymphocytes Relative: 12 %
Lymphs Abs: 1.9 10*3/uL (ref 0.7–4.0)
MCH: 30.2 pg (ref 26.0–34.0)
MCHC: 33.8 g/dL (ref 30.0–36.0)
MCV: 89.3 fL (ref 80.0–100.0)
Monocytes Absolute: 1.3 10*3/uL — ABNORMAL HIGH (ref 0.1–1.0)
Monocytes Relative: 8 %
Neutro Abs: 12.7 10*3/uL — ABNORMAL HIGH (ref 1.7–7.7)
Neutrophils Relative %: 80 %
Platelets: 244 10*3/uL (ref 150–400)
RBC: 5.24 MIL/uL (ref 4.22–5.81)
RDW: 13.1 % (ref 11.5–15.5)
WBC: 16 10*3/uL — ABNORMAL HIGH (ref 4.0–10.5)
nRBC: 0 % (ref 0.0–0.2)

## 2022-12-19 LAB — COMPREHENSIVE METABOLIC PANEL
ALT: 70 U/L — ABNORMAL HIGH (ref 0–44)
AST: 51 U/L — ABNORMAL HIGH (ref 15–41)
Albumin: 4.4 g/dL (ref 3.5–5.0)
Alkaline Phosphatase: 70 U/L (ref 38–126)
Anion gap: 14 (ref 5–15)
BUN: 19 mg/dL (ref 8–23)
CO2: 23 mmol/L (ref 22–32)
Calcium: 9.2 mg/dL (ref 8.9–10.3)
Chloride: 99 mmol/L (ref 98–111)
Creatinine, Ser: 1.16 mg/dL (ref 0.61–1.24)
GFR, Estimated: >60 mL/min (ref >60)
Glucose, Bld: 227 mg/dL — ABNORMAL HIGH (ref 70–99)
Potassium: 4.5 mmol/L (ref 3.5–5.1)
Sodium: 136 mmol/L (ref 135–145)
Total Bilirubin: 1 mg/dL (ref 0.3–1.2)
Total Protein: 7.9 g/dL (ref 6.5–8.1)

## 2022-12-19 LAB — MAGNESIUM: Magnesium: 2 mg/dL (ref 1.7–2.4)

## 2022-12-19 LAB — CBG MONITORING, ED: Glucose-Capillary: 248 mg/dL — ABNORMAL HIGH (ref 70–99)

## 2022-12-19 LAB — PHOSPHORUS: Phosphorus: 2.7 mg/dL (ref 2.5–4.6)

## 2022-12-19 MED ORDER — LEVETIRACETAM IN NACL 1500 MG/100ML IV SOLN
1500.0000 mg | Freq: Once | INTRAVENOUS | Status: AC
  Administered 2022-12-19: 1500 mg via INTRAVENOUS
  Filled 2022-12-19: qty 100

## 2022-12-19 MED ORDER — IBUPROFEN 200 MG PO TABS
400.0000 mg | ORAL_TABLET | Freq: Once | ORAL | Status: AC
  Administered 2022-12-19: 400 mg via ORAL
  Filled 2022-12-19: qty 2

## 2022-12-19 MED ORDER — LEVETIRACETAM 500 MG PO TABS: 500.0000 mg | ORAL_TABLET | Freq: Two times a day (BID) | ORAL | 0 refills | Status: DC

## 2022-12-19 MED ORDER — LACTATED RINGERS IV BOLUS
1000.0000 mL | Freq: Once | INTRAVENOUS | Status: AC
  Administered 2022-12-19: 1000 mL via INTRAVENOUS

## 2022-12-19 NOTE — ED Provider Notes (Signed)
[facility] Provider Note   CSN: [id number] Arrival date & time: 12/19/22  1538     History  Chief Complaint  Patient presents with   Seizures    Darrell Thompson is a 62 y.o. male.  Patient is a 62 year old male with a past medical history of hypertension, diabetes and seizure disorder presenting to the emergency department for breakthrough seizure.  Per EMS, the patient ran out of his Keppra 3 days ago and had a witnessed seizure by his wife today that lasted about 40 seconds.  Patient was postictal on EMS arrival.  They gave him 10 mg of diazepam for seizure prophylaxis.  There is no report of trauma.  Patient denies any vomiting or diarrhea.  Further history is limited due to patient's postictal state.  The history is provided by the patient and the EMS personnel. The history is limited by the condition of the patient. No language interpreter was used.  Seizures      Home Medications Prior to Admission medications   Medication Sig Start Date End Date Taking? Authorizing Provider  levETIRAcetam (KEPPRA) 500 MG tablet Take 1 tablet (500 mg total) by mouth 2 (two) times daily. 12/19/22 01/18/23 Yes Maylon Peppers, Shauntay Brunelli K, DO  metFORMIN (GLUCOPHAGE-XR) 500 MG 24 hr tablet 1 tab by mouth twice daily with foood Patient taking differently: Take 500 mg by mouth 2 (two) times daily with a meal. 10/05/21  Yes Mack Hook, MD  atorvastatin (LIPITOR) 40 MG tablet 1 tab by mouth daily with evening meal Patient not taking: Reported on 12/19/2022 10/05/21   Mack Hook, MD  losartan (COZAAR) 100 MG tablet 1/2 tab by mouth daily Patient not taking: Reported on 12/19/2022 10/05/21   Mack Hook, MD      Allergies    Patient has no known allergies.    Review of Systems   Review of Systems  Neurological:  Positive for seizures.    Physical Exam Updated Vital Signs BP (!) 156/84   Pulse 95   Temp 98.6 F (37 C) (Oral)    Resp 19   Ht '5\' 2"'$  (1.575 m)   Wt 68 kg   SpO2 92%   BMI 27.42 kg/m  Physical Exam Vitals and nursing note reviewed.  Constitutional:      General: He is not in acute distress.    Appearance: He is not diaphoretic.     Comments: Drowsy but arousable to verbal stimulation  HENT:     Head: Normocephalic and atraumatic.     Nose: Nose normal.     Mouth/Throat:     Mouth: Mucous membranes are moist.     Comments: Small nonbleeding left tongue bite with small amount of dried blood on the lip Eyes:     Extraocular Movements: Extraocular movements intact.     Conjunctiva/sclera: Conjunctivae normal.     Pupils: Pupils are equal, round, and reactive to light.  Cardiovascular:     Rate and Rhythm: Regular rhythm. Tachycardia present.     Heart sounds: Normal heart sounds.  Pulmonary:     Effort: Pulmonary effort is normal.     Breath sounds: Normal breath sounds.  Abdominal:     General: Abdomen is flat.     Palpations: Abdomen is soft.     Tenderness: There is no abdominal tenderness.  Musculoskeletal:        General: No swelling or deformity. Normal range of motion.     Cervical back: Normal range of  motion and neck supple.  Skin:    General: Skin is warm and dry.  Neurological:     General: No focal deficit present.     Mental Status: He is oriented to person, place, and time.     Sensory: No sensory deficit.     Motor: No weakness.  Psychiatric:        Mood and Affect: Mood normal.        Behavior: Behavior normal.     ED Results / Procedures / Treatments   Labs (all labs ordered are listed, but only abnormal results are displayed) Labs Reviewed  COMPREHENSIVE METABOLIC PANEL - Abnormal; Notable for the following components:      Result Value   Glucose, Bld 227 (*)    AST 51 (*)    ALT 70 (*)    All other components within normal limits  CBC WITH DIFFERENTIAL/PLATELET - Abnormal; Notable for the following components:   WBC 16.0 (*)    Neutro Abs 12.7 (*)     Monocytes Absolute 1.3 (*)    All other components within normal limits  CBG MONITORING, ED - Abnormal; Notable for the following components:   Glucose-Capillary 248 (*)    All other components within normal limits  MAGNESIUM  PHOSPHORUS    EKG None  Radiology No results found.  Procedures Procedures    Medications Ordered in ED Medications  ibuprofen (ADVIL) tablet 400 mg (has no administration in time range)  levETIRAcetam (KEPPRA) IVPB 1500 mg/ 100 mL premix (0 mg Intravenous Stopped 12/19/22 1707)  lactated ringers bolus 1,000 mL (0 mLs Intravenous Stopped 12/19/22 1941)    ED Course/ Medical Decision Making/ A&P Clinical Course as of 12/19/22 2028  Sun Dec 19, 2022  1744 Upon reassessment, patients labs are within normal range. He is sleeping on exam but easily arousable to verbal stimulation, likely due to versed prior to arrival. He will be reassessed until he is able to ambulate steadily. Wife is at bedside who is able to drive him home once he is safe for discharge. [VK]  2024 Patient is back to his neurologic baseline and has been able to ambulate steadily in the emergency department.  He is stable for discharge home with primary care and neurology follow-up.  He will be given refill of his Keppra.  He was given strict return precautions. [VK]    Clinical Course User Index [VK] Kemper Durie, DO                             Medical Decision Making This patient presents to the ED with chief complaint(s) of break through seizure with pertinent past medical history of seizure disorder, HTN, DM which further complicates the presenting complaint. The complaint involves an extensive differential diagnosis and also carries with it a high risk of complications and morbidity.    The differential diagnosis includes medication noncompliance, electrolyte abnormality, hyper or hypoglycemia, patient has no signs of trauma on exam from his seizure  Additional history  obtained: Additional history obtained from EMS  Records reviewed [facility] and outpatient neurology records  ED Course and Reassessment: On patient's arrival to the emergency department he is postictal appearing but is arousable and following commands in all 4 extremities and is able to tell me that he is missed his Keppra.  He will be given a Keppra load.  He will be closely monitored and be placed on seizure precautions.  He will have labs to evaluate for electrolyte abnormality as well as have glucose check.  He will be monitored until he returns back to his neurologic baseline.  Independent labs interpretation:  The following labs were independently interpreted: leukocytosis likely due to seizure, mild hyperglycemia otherwise within normal range  Independent visualization of imaging: N/A  Consultation: - Consulted or discussed management/test interpretation w/ external professional: N/A  Consideration for admission or further workup: Patient has no emergent conditions requiring admission or further work-up at this time and is stable for discharge home with primary care follow-up  Social Determinants of health: N/A    Amount and/or Complexity of Data Reviewed Labs: ordered.  Risk OTC drugs. Prescription drug management.          Final Clinical Impression(s) / ED Diagnoses Final diagnoses:  Seizure ([place])  Hyperglycemia    Rx / DC Orders ED Discharge Orders          Ordered    levETIRAcetam (KEPPRA) 500 MG tablet  2 times daily        12/19/22 2025              Kemper Durie, [place] 12/19/22 2028

## 2022-12-19 NOTE — ED Triage Notes (Signed)
Pt arriving from home following 40 second seizure per wife. Wife also reports he had a seizure this morning. Pt has been out of his seizure medication 3 days ago.

## 2022-12-19 NOTE — Telephone Encounter (Signed)
Can you call and get him to come in for an appt in the next 4 weeks?

## 2022-12-19 NOTE — Discharge Instructions (Signed)
You were seen in the emergency department after your seizure.  Your blood sugar was mildly elevated which could be due to your seizures otherwise your labs appeared normal.  You likely had a seizure today from running out of your Keppra.  We gave you a dose of Keppra through the IV and gave you a refill of your Keppra.  You should continue to take this as prescribed.  You should follow-up with your primary doctor or your neurologist to have your symptoms rechecked and your blood sugar rechecked.  You should return to the emergency department if you have back-to-back seizures without returning to her normal self in between, you have a prolonged seizure lasting more than 5 to 10 minutes, you injure yourself during a seizure or if you have any other new or concerning symptoms.  Please do not drive for at least 6 months or until you are cleared by neurology.  Lo atendieron en el departamento de emergencias despus de su convulsin. Su nivel de azcar en la sangre estaba ligeramente elevado, lo que podra deberse a sus convulsiones; de lo contrario, sus anlisis parecan normales. Probablemente haya tenido una convulsin hoy por quedarse sin Keppra. Le administramos una dosis de Keppra por va intravenosa y le renovamos su Keppra. Debe continuar tomndolo segn lo prescrito. Debe realizar un seguimiento con su mdico de atencin primaria o su neurlogo para que vuelvan a Chief Technology Officer sus sntomas y Water quality scientist de Dispensing optician. Debe regresar al departamento de emergencias si tiene convulsiones consecutivas sin regresar a su estado normal en el medio, si tiene una convulsin prolongada que dura ms de 5 a 10 minutos, se lastima durante una convulsin o si tiene cualquier otro sntomas nuevos o preocupantes. No conduzca durante al menos 6 meses o hasta que el neurologa le d el visto bueno.

## 2022-12-20 ENCOUNTER — Other Ambulatory Visit: Payer: Self-pay

## 2022-12-20 MED ORDER — LOSARTAN POTASSIUM 100 MG PO TABS: ORAL_TABLET | ORAL | 0 refills | Status: DC

## 2022-12-20 MED ORDER — ATORVASTATIN CALCIUM 40 MG PO TABS: ORAL_TABLET | ORAL | 0 refills | Status: DC

## 2022-12-20 NOTE — Telephone Encounter (Signed)
Patient has been scheduled for 01/17/23

## 2022-12-21 ENCOUNTER — Other Ambulatory Visit: Payer: Self-pay | Admitting: Internal Medicine

## 2023-01-17 ENCOUNTER — Other Ambulatory Visit (INDEPENDENT_AMBULATORY_CARE_PROVIDER_SITE_OTHER): Payer: BLUE CROSS/BLUE SHIELD

## 2023-01-17 DIAGNOSIS — Z79899 Other long term (current) drug therapy: Secondary | ICD-10-CM

## 2023-01-17 DIAGNOSIS — E782 Mixed hyperlipidemia: Secondary | ICD-10-CM | POA: Diagnosis not present

## 2023-01-17 DIAGNOSIS — E119 Type 2 diabetes mellitus without complications: Secondary | ICD-10-CM

## 2023-01-18 LAB — CBC WITH DIFFERENTIAL/PLATELET
Basophils Absolute: 0 10*3/uL (ref 0.0–0.2)
Basos: 0 %
EOS (ABSOLUTE): 0.1 10*3/uL (ref 0.0–0.4)
Eos: 2 %
Hematocrit: 44.3 % (ref 37.5–51.0)
Hemoglobin: 15 g/dL (ref 13.0–17.7)
Immature Grans (Abs): 0 10*3/uL (ref 0.0–0.1)
Immature Granulocytes: 0 %
Lymphocytes Absolute: 3.7 10*3/uL — ABNORMAL HIGH (ref 0.7–3.1)
Lymphs: 54 %
MCH: 29.7 pg (ref 26.6–33.0)
MCHC: 33.9 g/dL (ref 31.5–35.7)
MCV: 88 fL (ref 79–97)
Monocytes Absolute: 0.7 10*3/uL (ref 0.1–0.9)
Monocytes: 9 %
Neutrophils Absolute: 2.4 10*3/uL (ref 1.4–7.0)
Neutrophils: 35 %
Platelets: 231 10*3/uL (ref 150–450)
RBC: 5.05 x10E6/uL (ref 4.14–5.80)
RDW: 13.1 % (ref 11.6–15.4)
WBC: 7 10*3/uL (ref 3.4–10.8)

## 2023-01-18 LAB — LIPID PANEL W/O CHOL/HDL RATIO
Cholesterol, Total: 277 mg/dL — ABNORMAL HIGH (ref 100–199)
HDL: 32 mg/dL — ABNORMAL LOW (ref >39)
LDL Chol Calc (NIH): 173 mg/dL — ABNORMAL HIGH (ref 0–99)
Triglycerides: 371 mg/dL — ABNORMAL HIGH (ref 0–149)
VLDL Cholesterol Cal: 72 mg/dL — ABNORMAL HIGH (ref 5–40)

## 2023-01-18 LAB — HEMOGLOBIN A1C
Est. average glucose Bld gHb Est-mCnc: 214 mg/dL
Hgb A1c MFr Bld: 9.1 % — ABNORMAL HIGH (ref 4.8–5.6)

## 2023-01-18 LAB — COMPREHENSIVE METABOLIC PANEL
ALT: 77 IU/L — ABNORMAL HIGH (ref 0–44)
AST: 40 IU/L (ref 0–40)
Albumin/Globulin Ratio: 1.5 (ref 1.2–2.2)
Albumin: 4.5 g/dL (ref 3.9–4.9)
Alkaline Phosphatase: 92 IU/L (ref 44–121)
BUN/Creatinine Ratio: 13 (ref 10–24)
BUN: 14 mg/dL (ref 8–27)
Bilirubin Total: 0.3 mg/dL (ref 0.0–1.2)
CO2: 25 mmol/L (ref 20–29)
Calcium: 9.6 mg/dL (ref 8.6–10.2)
Chloride: 100 mmol/L (ref 96–106)
Creatinine, Ser: 1.12 mg/dL (ref 0.76–1.27)
Globulin, Total: 3.1 g/dL (ref 1.5–4.5)
Glucose: 181 mg/dL — ABNORMAL HIGH (ref 70–99)
Potassium: 5.1 mmol/L (ref 3.5–5.2)
Sodium: 139 mmol/L (ref 134–144)
Total Protein: 7.6 g/dL (ref 6.0–8.5)
eGFR: 74 mL/min/{1.73_m2} (ref >59)

## 2023-01-20 ENCOUNTER — Ambulatory Visit: Payer: BLUE CROSS/BLUE SHIELD | Admitting: Internal Medicine

## 2023-01-20 ENCOUNTER — Encounter: Payer: Self-pay | Admitting: Internal Medicine

## 2023-01-20 VITALS — BP 150/80 | HR 96 | Resp 12 | Ht 61.0 in | Wt 151.0 lb

## 2023-01-20 DIAGNOSIS — E119 Type 2 diabetes mellitus without complications: Secondary | ICD-10-CM | POA: Diagnosis not present

## 2023-01-20 DIAGNOSIS — B351 Tinea unguium: Secondary | ICD-10-CM

## 2023-01-20 DIAGNOSIS — E782 Mixed hyperlipidemia: Secondary | ICD-10-CM

## 2023-01-20 DIAGNOSIS — R569 Unspecified convulsions: Secondary | ICD-10-CM

## 2023-01-20 DIAGNOSIS — I10 Essential (primary) hypertension: Secondary | ICD-10-CM

## 2023-01-20 MED ORDER — LOSARTAN POTASSIUM 100 MG PO TABS: ORAL_TABLET | ORAL | 3 refills | Status: DC

## 2023-01-20 MED ORDER — EMPAGLIFLOZIN 10 MG PO TABS: 10.0000 mg | ORAL_TABLET | Freq: Every day | ORAL | 3 refills | Status: AC

## 2023-01-20 MED ORDER — METFORMIN HCL ER 500 MG PO TB24: ORAL_TABLET | ORAL | 3 refills | Status: DC

## 2023-01-20 MED ORDER — ATORVASTATIN CALCIUM 40 MG PO TABS: ORAL_TABLET | ORAL | 3 refills | Status: DC

## 2023-01-20 MED ORDER — TERBINAFINE HCL 250 MG PO TABS: ORAL_TABLET | ORAL | 0 refills | Status: AC

## 2023-01-20 MED ORDER — LEVETIRACETAM 500 MG PO TABS: ORAL_TABLET | ORAL | 3 refills | Status: DC

## 2023-01-20 NOTE — Progress Notes (Signed)
    Subjective:    Patient ID: Darrell Thompson, male   DOB: 1961/08/30, 62 y.o.   MRN: [id number]   HPI  Here after last seen 07/2021.  Has not received care elsewhere.   Difficult historian   Hypertension:  not clear how long he was without losartan.  Was refilled last month for 30 days once he made an appt.    2.  DM Type 2:  again was out of medication for a time.  Back on the Metformin in February.    3.  Hyperlipidemia:  Did not fill the atorvastatin refilled end of February.   4.  Seizure:  Very difficult to get a history.  Does not want to go back to Neurology.  Does not appear he ever followed up for EEG.  MRI was normal save for signs of sinusitis.  He never followed up here.  Had another seizure for which he want to ED last month.  Stated in ED he ran out of Keppra 3 days before.  Not clear he was taking regularly even before that.  5.  Toenail fungus.  All toenails involved  Current Meds  Medication Sig   losartan (COZAAR) 100 MG tablet 1/2 tab by mouth daily   metFORMIN (GLUCOPHAGE-XR) 500 MG 24 hr tablet Take 1 tablet by mouth twice daily with food   No Known Allergies   Review of Systems    Objective:   BP (!) 150/80 (BP Location: Right Arm, Patient Position: Sitting, Cuff Size: Normal)   Pulse 96   Resp 12   Ht 5\' 1"  (1.549 m)   Wt 151 lb (68.5 kg)   BMI 28.53 kg/m   Physical Exam NAD HEENT:  PERRL, EOMI Neck:  Supple, No adenopathy Chest:  CTA CV:  RRR with normal S1 and S2, No S3, S4 or rmurmur.  No carotid bruits.  Carotid, radial and DP pulses normal and equal Abd:  S, NT, No HSM or mass, + BS LE:  No edema.  Toenails all thickened, discolored and crumbling.   Neuro:  A & O x 3, CN II-XII grossly intact.  Motor 5/5, DTRs 2+/4, gait normal.   Assessment & Plan  Hypertension:  Losartan 100 mg daily.   2.  DM:  Metformin ER refilled at higher dose 1000 mg twice daily. Add Jardiance 10 mg daily. Referral for diabetic eye exam.    3.   Hyperlipidemia:  refill Atorvastatin.    4.  Seizure Disorder:  Discussed he should not be driving.  Unwilling to consider referral to another Neurology group.  Would like for him to get EEG at some point.  Levatiracetam 500 mg twice daily.    5.  Toenail onychomycosis:  Terbinafine 250 mg daily for 84 days.  Went over shoe and shower floor treatment.  Hepatic profile in 6 weeks and fasting labs in 12 weeks.    5.

## 2023-01-20 NOTE — Patient Instructions (Signed)
For foot and toenail fungus:  Spray your shoes with Lysol or Lotrimin Antifungal spray when you start the medication for your feet.   Re-spray your the shoes you wear with each wearing and allow to dry before wearing again You will need to continue to spray the shoes you have during the infection of your toenails and feet have been thrown out due to wear.  Once your toenails and feet are clear of infection, the shoes you buy new do not necessarily need to be sprayed. Clean your shower floor once to twice daily with bleach containing cleaner. 

## 2023-03-07 ENCOUNTER — Other Ambulatory Visit: Payer: BLUE CROSS/BLUE SHIELD

## 2023-03-14 ENCOUNTER — Other Ambulatory Visit (INDEPENDENT_AMBULATORY_CARE_PROVIDER_SITE_OTHER): Payer: BLUE CROSS/BLUE SHIELD

## 2023-03-14 DIAGNOSIS — Z79899 Other long term (current) drug therapy: Secondary | ICD-10-CM

## 2023-03-15 LAB — HEPATIC FUNCTION PANEL
ALT: 124 IU/L — ABNORMAL HIGH (ref 0–44)
AST: 90 IU/L — ABNORMAL HIGH (ref 0–40)
Albumin: 4.3 g/dL (ref 3.9–4.9)
Alkaline Phosphatase: 101 IU/L (ref 44–121)
Bilirubin Total: 0.4 mg/dL (ref 0.0–1.2)
Bilirubin, Direct: 0.17 mg/dL (ref 0.00–0.40)
Total Protein: 7.6 g/dL (ref 6.0–8.5)

## 2023-04-07 ENCOUNTER — Other Ambulatory Visit (INDEPENDENT_AMBULATORY_CARE_PROVIDER_SITE_OTHER): Payer: BLUE CROSS/BLUE SHIELD

## 2023-04-07 DIAGNOSIS — R748 Abnormal levels of other serum enzymes: Secondary | ICD-10-CM | POA: Diagnosis not present

## 2023-04-08 LAB — HEPATITIS B SURFACE ANTIGEN: Hepatitis B Surface Ag: NEGATIVE

## 2023-04-08 LAB — HEPATIC FUNCTION PANEL
ALT: 95 IU/L — ABNORMAL HIGH (ref 0–44)
AST: 61 IU/L — ABNORMAL HIGH (ref 0–40)
Albumin: 4.6 g/dL (ref 3.9–4.9)
Alkaline Phosphatase: 92 IU/L (ref 44–121)
Bilirubin Total: 0.5 mg/dL (ref 0.0–1.2)
Bilirubin, Direct: 0.19 mg/dL (ref 0.00–0.40)
Total Protein: 7.6 g/dL (ref 6.0–8.5)

## 2023-04-08 LAB — HEPATITIS B CORE ANTIBODY, TOTAL: Hep B Core Total Ab: POSITIVE — AB

## 2023-04-08 LAB — HEPATITIS B SURFACE ANTIBODY,QUALITATIVE: Hep B Surface Ab, Qual: REACTIVE

## 2023-04-08 LAB — HEPATITIS C ANTIBODY: Hep C Virus Ab: NONREACTIVE

## 2023-04-08 LAB — HEPATITIS A ANTIBODY, TOTAL: hep A Total Ab: POSITIVE — AB

## 2023-04-21 ENCOUNTER — Other Ambulatory Visit (INDEPENDENT_AMBULATORY_CARE_PROVIDER_SITE_OTHER): Payer: BLUE CROSS/BLUE SHIELD

## 2023-04-21 DIAGNOSIS — Z79899 Other long term (current) drug therapy: Secondary | ICD-10-CM

## 2023-04-21 DIAGNOSIS — E782 Mixed hyperlipidemia: Secondary | ICD-10-CM | POA: Diagnosis not present

## 2023-04-21 DIAGNOSIS — E119 Type 2 diabetes mellitus without complications: Secondary | ICD-10-CM

## 2023-04-22 ENCOUNTER — Other Ambulatory Visit: Payer: BLUE CROSS/BLUE SHIELD

## 2023-04-22 LAB — BASIC METABOLIC PANEL
BUN/Creatinine Ratio: 19 (ref 10–24)
BUN: 19 mg/dL (ref 8–27)
CO2: 24 mmol/L (ref 20–29)
Calcium: 9.7 mg/dL (ref 8.6–10.2)
Chloride: 100 mmol/L (ref 96–106)
Creatinine, Ser: 1.01 mg/dL (ref 0.76–1.27)
Glucose: 117 mg/dL — ABNORMAL HIGH (ref 70–99)
Potassium: 4.3 mmol/L (ref 3.5–5.2)
Sodium: 139 mmol/L (ref 134–144)
eGFR: 84 mL/min/{1.73_m2} (ref >59)

## 2023-04-22 LAB — LIPID PANEL W/O CHOL/HDL RATIO
Cholesterol, Total: 259 mg/dL — ABNORMAL HIGH (ref 100–199)
HDL: 32 mg/dL — ABNORMAL LOW (ref >39)
LDL Chol Calc (NIH): 169 mg/dL — ABNORMAL HIGH (ref 0–99)
Triglycerides: 301 mg/dL — ABNORMAL HIGH (ref 0–149)
VLDL Cholesterol Cal: 58 mg/dL — ABNORMAL HIGH (ref 5–40)

## 2023-04-22 LAB — HEMOGLOBIN A1C
Est. average glucose Bld gHb Est-mCnc: 206 mg/dL
Hgb A1c MFr Bld: 8.8 % — ABNORMAL HIGH (ref 4.8–5.6)

## 2023-04-27 ENCOUNTER — Encounter: Payer: Self-pay | Admitting: Internal Medicine

## 2023-04-27 ENCOUNTER — Ambulatory Visit (INDEPENDENT_AMBULATORY_CARE_PROVIDER_SITE_OTHER): Payer: BLUE CROSS/BLUE SHIELD | Admitting: Internal Medicine

## 2023-04-27 VITALS — BP 142/88 | HR 80 | Resp 12 | Ht 61.0 in | Wt 153.0 lb

## 2023-04-27 DIAGNOSIS — E119 Type 2 diabetes mellitus without complications: Secondary | ICD-10-CM | POA: Diagnosis not present

## 2023-04-27 DIAGNOSIS — E782 Mixed hyperlipidemia: Secondary | ICD-10-CM | POA: Diagnosis not present

## 2023-04-27 DIAGNOSIS — R748 Abnormal levels of other serum enzymes: Secondary | ICD-10-CM

## 2023-04-27 DIAGNOSIS — I10 Essential (primary) hypertension: Secondary | ICD-10-CM

## 2023-04-27 DIAGNOSIS — R569 Unspecified convulsions: Secondary | ICD-10-CM

## 2023-04-27 DIAGNOSIS — B351 Tinea unguium: Secondary | ICD-10-CM

## 2023-04-27 MED ORDER — LOSARTAN POTASSIUM 100 MG PO TABS: ORAL_TABLET | ORAL | 3 refills | Status: AC

## 2023-04-27 NOTE — Progress Notes (Signed)
Subjective:    Patient ID: Darrell Thompson, male   DOB: 01-12-1961, 62 y.o.   MRN: [id number]   HPI   DM:  A1C recently down a bit to 8.8% from 9.1%.  Of all his meds, only taking Metformin XR 1000 mg twice daily.  States he does not miss meds.  Never filled the Jardiance  2.  Toenail onychomycosis:  Took terbinafine:  took for 10 weeks. He states he does not feel his toenails are any better.  His liver enzymes went up and we asked him to stop the med.  He also developed a rash on his ankles about 9 weeks into taking the Terbinafine.  He states the rash was itchy and so he started what sounds like cortisone 10 twice daily with improvement.   He thinks he has been off the Terbinafine now for about 1 month.  The rash is better, but not gone.    3.  Elevated Liver enzymes:  Hep A shows immunity, Hep B shows wild immunity.  Hep C screen negative.  Appears to have been secondary to Terbinafine use as coming down now that off med.    4.  Hyperlipidemia:  Took Atorvastatin for 5 days and then stopped the medication.  States he did not feel well--was dizzy like a drunk.  Cholesterol remains with all levels out of goal range.  Lipid Panel     Component Value Date/Time   CHOL 259 (H) 04/21/2023 0900   TRIG 301 (H) 04/21/2023 0900   HDL 32 (L) 04/21/2023 0900   LDLCALC 169 (H) 04/21/2023 0900   LABVLDL 58 (H) 04/21/2023 0900     5.  Hypertension:  Took Losartan for 5 days after seen in March and then stopped due to the dizziness.   States his blood pressure is generally in a good range at home, today was 129/85.    6.  Seizure:  taking Keppra twice daily and does not miss.  Shared with medical assistant, however, that he was not taking.      Current Meds  Medication Sig   metFORMIN (GLUCOPHAGE-XR) 500 MG 24 hr tablet 2 tabs by mouth twice daily with meals   No Known Allergies   Review of Systems    Objective:   BP (!) 142/88 (BP Location: Left Arm, Patient Position: Sitting,  Cuff Size: Normal)   Pulse 80   Resp 12   Ht 5\' 1"  (1.549 m)   Wt 153 lb (69.4 kg)   BMI 28.91 kg/m   Physical Exam NAD Lungs:  CTA CV:  RRR without murmur or rub.  Radial pulses normal and equal. LE:  trace edema.  Has subcutaneous pinpoint red dots scattered over ankles/pretibial areas that appears from scratching.  Hyperpigmented area on right ankle/pretib area where he states he peeled off skin when he dropped something on it 8 months ago--not part of the rash.   Toenails with one or two that appear to have normal base and distal thickening and discoloration.  He is not clear if he feels that is new or same as last visit.   Assessment & Plan  DM:  Attempted to have a conversation about lifestyle changes and getting started on Jardiance or other medication to meet goal of at least 7.5% range, but patient is oddly argumentative about any change.  Discussed we would be happy to continue to follow him here, but perhaps he would respond better with a different provider.  He will let us know  if he decides to go elsewhere and we can transfer records if need be.  2.  Hypertension:  again, really not wanting to take anything for BP.  Discussed long term complications of poorly controlled BP.  Asked him to take the Losartan 50 mg daily until he follows up end of month for lab and add BP and pulse, comparing his home monitor to ours.    3.  Hyperlipidemia:  unwilling to take Atorvastatin or other cholesterol lowering med at this time.  Consider gradual addition of meds.    4.  Toenail onychomycosis:  Possible derm reaction to Terbinafine and certainly had increased liver enzymes.  Consider [facility] for future treatment.  He is not interested today.    5.  Seizure:  Not clear if taking Keppra.  Encouraged him to do so with ED visit for seizure in February.  Discussed he needs to be taking the med due to breakthrough seizure in February.  I do not believe he ever followed up with Neuro for an EEG,  though MR of brain was normal in 2023.

## 2023-05-24 ENCOUNTER — Other Ambulatory Visit: Payer: BLUE CROSS/BLUE SHIELD

## 2024-01-18 ENCOUNTER — Other Ambulatory Visit: Payer: Self-pay | Admitting: Internal Medicine

## 2024-03-08 ENCOUNTER — Other Ambulatory Visit: Payer: Self-pay | Admitting: Internal Medicine

## 2024-04-14 ENCOUNTER — Other Ambulatory Visit: Payer: Self-pay | Admitting: Internal Medicine

## 2024-04-20 NOTE — Telephone Encounter (Signed)
 Left a voicemail asking to return call. 04/20/24.

## 2024-07-16 ENCOUNTER — Other Ambulatory Visit: Payer: Self-pay | Admitting: Internal Medicine

## 2024-08-15 NOTE — Telephone Encounter (Signed)
 Left a voicemail asking to return call to schedule an appointment, notify he needs appointment for future medication refills 08/15/24.

## 2024-10-09 NOTE — Telephone Encounter (Signed)
 Left a voicemail asking to return call to schedule an appointment, notify he needs appointment for future medication refills 10/09/2024.

## 2024-10-09 NOTE — Telephone Encounter (Signed)
 Patient called back today and states he has moved to a different provider . Patient states he is currently getting refills from new provider.
